# Patient Record
Sex: Male | Born: 1995 | Race: White | Hispanic: No | Marital: Single | Smoking: Never smoker
Health system: Southern US, Community
[De-identification: ages and names within clinical notes are randomized; demographics above are authoritative.]

## PROBLEM LIST (undated history)

## (undated) DIAGNOSIS — Z87898 Personal history of other specified conditions: Secondary | ICD-10-CM

## (undated) HISTORY — PX: INNER EAR SURGERY: SHX679

## (undated) HISTORY — PX: OTHER SURGICAL HISTORY: SHX169

---

## 2018-12-16 ENCOUNTER — Other Ambulatory Visit: Payer: Self-pay

## 2018-12-16 ENCOUNTER — Encounter (HOSPITAL_COMMUNITY): Payer: Self-pay | Admitting: Emergency Medicine

## 2018-12-16 ENCOUNTER — Emergency Department (HOSPITAL_COMMUNITY)
Admission: EM | Admit: 2018-12-16 | Discharge: 2018-12-16 | Disposition: A | Discharge status: Home / Self Care | Payer: Self-pay | Attending: Emergency Medicine | Admitting: Emergency Medicine

## 2018-12-16 ENCOUNTER — Emergency Department (HOSPITAL_COMMUNITY): Payer: Self-pay

## 2018-12-16 DIAGNOSIS — R569 Unspecified convulsions: Secondary | ICD-10-CM | POA: Insufficient documentation

## 2018-12-16 DIAGNOSIS — Z79899 Other long term (current) drug therapy: Secondary | ICD-10-CM | POA: Insufficient documentation

## 2018-12-16 LAB — CBC WITH DIFFERENTIAL/PLATELET
Abs Immature Granulocytes: 0.06 10*3/uL (ref 0.00–0.07)
Basophils Absolute: 0.1 10*3/uL (ref 0.0–0.1)
Basophils Relative: 0 %
Eosinophils Absolute: 0.1 10*3/uL (ref 0.0–0.5)
Eosinophils Relative: 1 %
HCT: 43.7 % (ref 39.0–52.0)
Hemoglobin: 14.1 g/dL (ref 13.0–17.0)
Immature Granulocytes: 0 %
Lymphocytes Relative: 20 %
Lymphs Abs: 3 10*3/uL (ref 0.7–4.0)
MCH: 26.9 pg (ref 26.0–34.0)
MCHC: 32.3 g/dL (ref 30.0–36.0)
MCV: 83.4 fL (ref 80.0–100.0)
Monocytes Absolute: 1.1 10*3/uL — ABNORMAL HIGH (ref 0.1–1.0)
Monocytes Relative: 7 %
Neutro Abs: 10.5 10*3/uL — ABNORMAL HIGH (ref 1.7–7.7)
Neutrophils Relative %: 72 %
Platelets: 351 10*3/uL (ref 150–400)
RBC: 5.24 MIL/uL (ref 4.22–5.81)
RDW: 13.2 % (ref 11.5–15.5)
WBC: 14.7 10*3/uL — ABNORMAL HIGH (ref 4.0–10.5)
nRBC: 0 % (ref 0.0–0.2)

## 2018-12-16 LAB — COMPREHENSIVE METABOLIC PANEL
ALT: 66 U/L — ABNORMAL HIGH (ref 0–44)
AST: 34 U/L (ref 15–41)
Albumin: 4 g/dL (ref 3.5–5.0)
Alkaline Phosphatase: 63 U/L (ref 38–126)
Anion gap: 11 (ref 5–15)
BUN: 13 mg/dL (ref 6–20)
CO2: 22 mmol/L (ref 22–32)
Calcium: 9.3 mg/dL (ref 8.9–10.3)
Chloride: 107 mmol/L (ref 98–111)
Creatinine, Ser: 1.12 mg/dL (ref 0.61–1.24)
GFR calc Af Amer: >60 mL/min (ref >60)
GFR calc non Af Amer: >60 mL/min (ref >60)
Glucose, Bld: 104 mg/dL — ABNORMAL HIGH (ref 70–99)
Potassium: 4 mmol/L (ref 3.5–5.1)
Sodium: 140 mmol/L (ref 135–145)
Total Bilirubin: 0.5 mg/dL (ref 0.3–1.2)
Total Protein: 6.9 g/dL (ref 6.5–8.1)

## 2018-12-16 LAB — URINALYSIS, ROUTINE W REFLEX MICROSCOPIC
Bacteria, UA: NONE SEEN
Bilirubin Urine: NEGATIVE
Glucose, UA: NEGATIVE mg/dL
Hgb urine dipstick: NEGATIVE
Ketones, ur: NEGATIVE mg/dL
Leukocytes,Ua: NEGATIVE
Nitrite: NEGATIVE
Protein, ur: 30 mg/dL — AB
Specific Gravity, Urine: 1.024 (ref 1.005–1.030)
pH: 7 (ref 5.0–8.0)

## 2018-12-16 LAB — RAPID URINE DRUG SCREEN, HOSP PERFORMED
Amphetamines: NOT DETECTED
Barbiturates: NOT DETECTED
Benzodiazepines: NOT DETECTED
Cocaine: NOT DETECTED
Opiates: NOT DETECTED
Tetrahydrocannabinol: NOT DETECTED

## 2018-12-16 MED ORDER — SODIUM CHLORIDE 0.9 % IV BOLUS
1000.0000 mL | Freq: Once | INTRAVENOUS | Status: AC
  Administered 2018-12-16: 1000 mL via INTRAVENOUS

## 2018-12-16 MED ORDER — ONDANSETRON 4 MG PO TBDP: 4.0000 mg | ORAL_TABLET | Freq: Once | ORAL | Status: DC

## 2018-12-16 NOTE — Discharge Instructions (Signed)
Please follow-up with the clinic that was provided for you in your discharge paperwork.  You were also given an ambulatory referral to neurology.  The neurology office will call you to schedule an appointment for follow-up.  In the meantime you should avoid driving and adhere to seizure precautions that were discussed while you are in the emergency department.  If you have another episode of seizure-like activity then you should return to your nearest emergency department for evaluation.

## 2018-12-16 NOTE — TOC Initial Note (Signed)
Transition of Care [facility]) - Initial/Assessment Note    Patient Details  Name: Parv Manthey MRN: [id number] Date of Birth: 02-20-1996  Transition of Care [facility]) CM/SW Contact:    Fuller Mandril, RN Phone Number: 12/16/2018, 2:23 PM  Clinical Narrative:                 [facility] consulted to assist uninsured pt with PCP establishment.     Expected Discharge Plan: [place] Barriers to Discharge: Barriers Resolved   Patient Goals and CMS Choice Patient states their goals for this hospitalization and ongoing recovery are:: get help with seizure      Expected Discharge Plan and Services Expected Discharge Plan: [place]   Discharge Planning Services: CM Consult, [facility]     Expected Discharge Date: 12/16/18                                    Prior Living Arrangements/Services                       Activities of Daily Living      Permission Sought/Granted                  Emotional Assessment   Attitude/Demeanor/Rapport: Engaged Affect (typically observed): Appropriate Orientation: : Oriented to Self, Oriented to Place, Oriented to  Time, Oriented to Situation Alcohol / Substance Use: Not Applicable Psych Involvement: No (comment)  Admission diagnosis:  sz There are no active problems to display for this patient.  PCP:  Patient, No Pcp Per Pharmacy:  No Pharmacies Listed    Social Determinants of Health (SDOH) Interventions    Readmission Risk Interventions No flowsheet data found.

## 2018-12-16 NOTE — TOC Transition Note (Signed)
Transition of Care [facility]) - CM/SW Discharge Note   Patient Details  Name: Dakota Perez MRN: [id number] Date of Birth: Jul 08, 1995  Transition of Care [facility]) CM/SW Contact:  Fuller Mandril, RN Phone Number: 12/16/2018, 2:24 PM   Clinical Narrative:    [facility] met with pt at bedside to confirm his residence was in [facility].  Pt states he has applied for medical assistance in the past but was unsuccessful due to having a job.   Final next level of care: [place] Barriers to Discharge: Barriers Resolved   Patient Goals and CMS Choice Patient states their goals for this hospitalization and ongoing recovery are:: get help with seizure      Discharge Placement                       Discharge Plan and Services   Discharge Planning Services: CM Consult, [facility] is not open on Friday, so [facility] not able to secure follow-up appointment.  Relayed information to pt and advised him to call early Monday morning to be seen ASAP.  Pt verbalized instructions.                  Social Determinants of Health (SDOH) Interventions     Readmission Risk Interventions No flowsheet data found.

## 2018-12-16 NOTE — ED Provider Notes (Signed)
[facility]   CSN: [id number] Arrival date & time: 12/16/18  1230     History   Chief Complaint Chief Complaint  Patient presents with  . Seizures    HPI Dakota PeachShane Perez is a 23 y.o. male.     HPI   Patient is a 23 year old male who presents to the emergency department today for evaluation of a possible seizure.  Patient's mother is at bedside and assists with the history.  Patient states that this morning he had been feeling very nauseated.  He did not get a lot of sleep last night because he has been traveling.  He was in on an airplane when he felt even more nauseated and then had what appeared to be a seizure.  His mother states that he became very rigid.  His arms were drawn towards his chest and his bilateral upper and lower extremities began to shake.  The episode lasted about 40 seconds and resolved.  She states that the patient was confused following this episode but she is not sure how long.  States following the episode he had multiple episodes of vomiting.  Denies urinary incontinence. Patient has returned to baseline since then.  He has never had a similar episode in the past.  Patient states he has been in his normal state of health prior to this episode.  He denies any headaches, vision changes, numbness, weakness to his arms or legs.  Denies any chest pain, shortness of breath, abdominal pain, nausea vomiting or diarrhea prior to the episode.  Denies any urinary symptoms or recent fevers.  Denies any use of drugs or alcohol.  Denies any recent head trauma.   History reviewed. No pertinent past medical history.  There are no active problems to display for this patient.   Past Surgical History:  Procedure Laterality Date  . INNER EAR SURGERY          Home Medications    Prior to Admission medications   Medication Sig Start Date End Date Taking? Authorizing Provider  Multiple Vitamins-Minerals (MULTIVITAMIN  ADULT PO) Take 2 tablets by mouth daily.   Yes [provider]    Family History History reviewed. No pertinent family history.  Social History Social History   Tobacco Use  . Smoking status: Not on file  Substance Use Topics  . Alcohol use: Not on file  . Drug use: Not on file     Allergies   Patient has no known allergies.   Review of Systems Review of Systems  Constitutional: Negative for chills and fever.  HENT: Negative for ear pain and sore throat.   Eyes: Negative for visual disturbance.  Respiratory: Negative for cough and shortness of breath.   Cardiovascular: Negative for chest pain.  Gastrointestinal: Positive for nausea and vomiting. Negative for abdominal pain, constipation and diarrhea.  Genitourinary: Negative for dysuria and hematuria.  Musculoskeletal: Negative for back pain.  Skin: Negative for color change and rash.  Neurological: Positive for seizures. Negative for dizziness, weakness, light-headedness, numbness and headaches.  All other systems reviewed and are negative.    Physical Exam Updated Vital Signs BP 118/75   Pulse 86   Temp 98.3 F (36.8 C) (Oral)   Resp 19   Ht 6' (1.829 m)   Wt 108.9 kg   SpO2 96%   BMI 32.55 kg/m   Physical Exam Vitals signs and nursing note reviewed.  Constitutional:      General: He is not  in acute distress.    Appearance: He is well-developed. He is not ill-appearing or toxic-appearing.  HENT:     Head: Normocephalic and atraumatic.     Mouth/Throat:     Mouth: Mucous membranes are moist.     Comments: No ecchymosis to the tongue Eyes:     Conjunctiva/sclera: Conjunctivae normal.  Neck:     Musculoskeletal: Neck supple.  Cardiovascular:     Rate and Rhythm: Normal rate and regular rhythm.     Heart sounds: Normal heart sounds. No murmur.  Pulmonary:     Effort: Pulmonary effort is normal. No respiratory distress.     Breath sounds: Normal breath sounds. No wheezing, rhonchi or rales.   Abdominal:     General: Bowel sounds are normal.     Palpations: Abdomen is soft.     Tenderness: There is no abdominal tenderness. There is no guarding or rebound.  Musculoskeletal:     Right lower leg: No edema.     Left lower leg: No edema.  Skin:    General: Skin is warm and dry.  Neurological:     Mental Status: He is alert.     Comments: Mental Status:  Alert, thought content appropriate, able to give a coherent history. Speech fluent without evidence of aphasia. Able to follow 2 step commands without difficulty.  Cranial Nerves:  II: pupils equal, round, reactive to light III,IV, VI: ptosis not present, extra-ocular motions intact bilaterally  V,VII: smile symmetric, facial light touch sensation equal VIII: hearing grossly normal to voice  X: uvula elevates symmetrically  XI: bilateral shoulder shrug symmetric and strong XII: midline tongue extension without fassiculations Motor:  Normal tone. 5/5 strength of BUE and BLE major muscle groups including strong and equal grip strength and dorsiflexion/plantar flexion Sensory: light touch normal in all extremities. Cerebellar: normal finger-to-nose with bilateral upper extremities      ED Treatments / Results  Labs (all labs ordered are listed, but only abnormal results are displayed) Labs Reviewed  CBC WITH DIFFERENTIAL/PLATELET - Abnormal; Notable for the following components:      Result Value   WBC 14.7 (*)    Neutro Abs 10.5 (*)    Monocytes Absolute 1.1 (*)    All other components within normal limits  COMPREHENSIVE METABOLIC PANEL - Abnormal; Notable for the following components:   Glucose, Bld 104 (*)    ALT 66 (*)    All other components within normal limits  URINALYSIS, ROUTINE W REFLEX MICROSCOPIC - Abnormal; Notable for the following components:   Protein, ur 30 (*)    All other components within normal limits  RAPID URINE DRUG SCREEN, HOSP PERFORMED  CBG MONITORING, ED    EKG None  Radiology Mr  Brain Wo Contrast  Result Date: 12/16/2018 CLINICAL DATA:  Seizure, new, nontraumatic. EXAM: MRI HEAD WITHOUT CONTRAST TECHNIQUE: Multiplanar, multiecho pulse sequences of the brain and surrounding structures were obtained without intravenous contrast. COMPARISON:  None. FINDINGS: Brain: No cortical finding to explain seizure. No swelling, infarction, hemorrhage, hydrocephalus, extra-axial collection or mass lesion. Vascular: Normal flow voids. Skull and upper cervical spine: Normal marrow signal. Sinuses/Orbits: Left retromastoid scarring, question prior mastoidectomy. IMPRESSION: Normal brain MRI. Electronically Signed   By: Marnee SpringJonathon  Watts M.D.   On: 12/16/2018 16:54    Procedures Procedures (including critical care time)  Medications Ordered in ED Medications  ondansetron (ZOFRAN-ODT) disintegrating tablet 4 mg (4 mg Oral Not Given 12/16/18 1546)  sodium chloride 0.9 % bolus 1,000 mL (1,000  mLs Intravenous New Bag/Given 12/16/18 1339)     Initial Impression / Assessment and Plan / ED Course  I have reviewed the triage vital signs and the nursing notes.  Pertinent labs & imaging results that were available during my care of the patient were reviewed by me and considered in my medical decision making (see chart for details).    Final Clinical Impressions(s) / ED Diagnoses   Final diagnoses:  Seizure-like activity ([place])   Pt presenting with new onset seizure. Does have recent decreased sleep and hydration due to traveling. No etoh or drug use. No recent head trauma. No infectious sxs.  VS reassuring. Exam is nonfocal and pt is alert and oriented.   Will check labs and imaging. CBC with mild leukocytosis, no anemia CMP with mildly elevated ALT, otherwise normal. UA with ketonuria but otherwise normal UDS negative  EKG is not pulling into chart, but was personally reviewed and showed normal sinus rhythm without ischemic changes.  No delta wave or evidence of Brugada syndrome.  MRI  showed no acute findings in the brain.  Pt given IVF and zofran. On reassessment patient continues to feel back to baseline.  No further episodes of vomiting while in the ED.  Discussed findings of work-up and plan for discharge.  Case management was consulted and discussed with the patient and family at bedside a plan for follow-up.  I also gave him a referral to neurology.  Seizure precautions were discussed.  Patient and family at bedside voiced understanding.  They are in agreement with plan.  Advised to return to the ER for new or worsening symptoms.  All questions answered.   Patient seen in conjunction with Dr. Roslynn Amble who personally evaluated the patient and is in agreement with plan.  ED Discharge Orders         Ordered    Ambulatory referral to Neurology    Comments: An appointment is requested in approximately: 1 week   12/16/18 1843           Bishop Dublin 12/16/18 1848    Lucrezia Starch, MD 12/17/18 [phone]

## 2018-12-16 NOTE — ED Triage Notes (Addendum)
Brought in by EMS. Pt was on a flight and had a witnessed seizure about 20 mins prior to landing. States he felt nauseated before seizure. Seizure lasted approximately 30 seconds and post ictal for about 10 mins. Vomited once after seizure. Ems reports pt was alert and oriented upon arrival. No history of seizures.

## 2019-10-24 ENCOUNTER — Telehealth: Payer: Self-pay | Admitting: General Practice

## 2019-10-24 NOTE — Telephone Encounter (Signed)
Attempts have been made to contact individual regarding ED referral. Call could not be completed.

## 2020-02-26 ENCOUNTER — Other Ambulatory Visit: Payer: Self-pay

## 2020-02-26 ENCOUNTER — Encounter: Payer: Self-pay | Admitting: Neurology

## 2020-02-26 ENCOUNTER — Ambulatory Visit
Admission: EM | Admit: 2020-02-26 | Discharge: 2020-02-26 | Disposition: A | Discharge status: Home / Self Care | Payer: Self-pay | Attending: Emergency Medicine | Admitting: Emergency Medicine

## 2020-02-26 DIAGNOSIS — R202 Paresthesia of skin: Secondary | ICD-10-CM

## 2020-02-26 HISTORY — DX: Personal history of other specified conditions: Z87.898

## 2020-02-26 NOTE — Discharge Instructions (Signed)
I am referring you to the bowel or neurology for duration of your symptoms.  If you have another seizure you need to go to the emergency department via EMS.

## 2020-02-26 NOTE — ED Triage Notes (Signed)
Patient reports that he has been having head tingling that occurs in the front and back of his head that started a while back but has increased over the last 2 weeks. States that he had a seizure around 1 year ago and had similar symptoms before this occurred. Reports that at time they did an MRI and bloodwork and did not find a cause for the seizure. Patient states that these "episodes" last for around 15-30 mins and occurs around 2-3 times a day, states that this happens typically at night.

## 2020-02-26 NOTE — ED Provider Notes (Signed)
[facility]    CSN: [id number] Arrival date & time: 02/26/20  1017      History   Chief Complaint Chief Complaint  Patient presents with  . Headache    HPI Dakota Perez is a 23 y.o. male.   24 year old male here for evaluation of tingling sensation in the front and back of his head.  Patient reports that this has been increasing over the past 2 weeks.  Patient reports that he had symptoms similar to this prior to having a seizure a year ago.  Patient was evaluated in the ER at that time and had a negative MRI but never followed up with neurology.  Patient is wondering if it might be eyestrain and wearing sunglasses helps.  Patient has an eye doctor appointment later this month.  Patient denies headache, visual changes, numbness, tingling, weakness, head trauma, new medications, or new supplement.     Past Medical History:  Diagnosis Date  . History of seizure     There are no problems to display for this patient.   Past Surgical History:  Procedure Laterality Date  . INNER EAR SURGERY         Home Medications    Prior to Admission medications   Medication Sig Start Date End Date Taking? Authorizing Provider  Multiple Vitamins-Minerals (MULTIVITAMIN ADULT PO) Take 2 tablets by mouth daily.   Yes [provider]    Family History Family History  Problem Relation Age of Onset  . Healthy Mother   . Healthy Father     Social History Social History   Tobacco Use  . Smoking status: Never Smoker  . Smokeless tobacco: Never Used  Vaping Use  . Vaping Use: Never used  Substance Use Topics  . Alcohol use: Never  . Drug use: Never     Allergies   Patient has no known allergies.   Review of Systems Review of Systems  Constitutional: Negative for activity change, appetite change, fatigue and fever.  Respiratory: Negative for cough and shortness of breath.   Cardiovascular: Negative for chest pain and palpitations.  Gastrointestinal:  Negative for nausea and vomiting.  Musculoskeletal: Negative for arthralgias, gait problem and myalgias.  Skin: Negative for rash.  Neurological: Negative for dizziness, tremors, seizures, syncope, light-headedness, numbness and headaches.  Hematological: Negative.   Psychiatric/Behavioral: Negative.      Physical Exam Triage Vital Signs ED Triage Vitals  Enc Vitals Group     BP 02/26/20 1106 132/82     Pulse Rate 02/26/20 1106 81     Resp 02/26/20 1106 18     Temp 02/26/20 1106 98.6 F (37 C)     Temp Source 02/26/20 1106 Oral     SpO2 02/26/20 1106 97 %     Weight 02/26/20 1103 250 lb (113.4 kg)     Height 02/26/20 1103 6\' 1"  (1.854 m)     Head Circumference --      Peak Flow --      Pain Score 02/26/20 1103 0     Pain Loc --      Pain Edu? --      Excl. in GC? --    No data found.  Updated Vital Signs BP 132/82 (BP Location: Left Arm)   Pulse 81   Temp 98.6 F (37 C) (Oral)   Resp 18   Ht 6\' 1"  (1.854 m)   Wt 250 lb (113.4 kg)   SpO2 97%   BMI 32.98 kg/m  Visual Acuity Right Eye Distance:   Left Eye Distance:   Bilateral Distance:    Right Eye Near:   Left Eye Near:    Bilateral Near:     Physical Exam Vitals and nursing note reviewed.  Constitutional:      General: He is not in acute distress.    Appearance: He is well-developed. He is not toxic-appearing.  HENT:     Head: Normocephalic and atraumatic.     Mouth/Throat:     Mouth: Mucous membranes are moist.     Pharynx: Oropharynx is clear.  Eyes:     General: No visual field deficit or scleral icterus.    Extraocular Movements: Extraocular movements intact.     Right eye: Normal extraocular motion and no nystagmus.     Left eye: Normal extraocular motion and no nystagmus.     Pupils: Pupils are equal, round, and reactive to light. Pupils are equal.     Right eye: Pupil is round and reactive.     Left eye: Pupil is round and reactive.     Comments: Patient has no visual field deficits.    Cardiovascular:     Rate and Rhythm: Normal rate and regular rhythm.     Heart sounds: Normal heart sounds. No murmur heard.  No friction rub. No gallop.   Pulmonary:     Effort: Pulmonary effort is normal.     Breath sounds: Normal breath sounds. No wheezing, rhonchi or rales.  Musculoskeletal:        General: No swelling or tenderness. Normal range of motion.     Cervical back: Normal range of motion and neck supple.     Comments: Bilateral grips 5/5, upper extremity strength 5/5 bilaterally, lower extremity strength 5/5 bilaterally.  Lymphadenopathy:     Cervical: No cervical adenopathy.  Skin:    General: Skin is warm and dry.     Capillary Refill: Capillary refill takes less than 2 seconds.     Findings: No erythema.  Neurological:     Mental Status: He is alert and oriented to person, place, and time.     GCS: GCS eye subscore is 4. GCS verbal subscore is 5. GCS motor subscore is 6.     Cranial Nerves: No cranial nerve deficit, dysarthria or facial asymmetry.     Sensory: No sensory deficit.     Motor: No weakness.     Coordination: Coordination normal.     Gait: Gait normal.     Deep Tendon Reflexes: Reflexes normal.  Psychiatric:        Mood and Affect: Mood normal.        Speech: Speech normal.        Behavior: Behavior normal.      UC Treatments / Results  Labs (all labs ordered are listed, but only abnormal results are displayed) Labs Reviewed - No data to display  EKG   Radiology No results found.  Procedures Procedures (including critical care time)  Medications Ordered in UC Medications - No data to display  Initial Impression / Assessment and Plan / UC Course  I have reviewed the triage vital signs and the nursing notes.  Pertinent labs & imaging results that were available during my care of the patient were reviewed by me and considered in my medical decision making (see chart for details).   Patient has been having a "weird, vague tingle" to  his forehead and the crown of his head.  This has been going on  for 2 weeks.  He thinks it may have been off and on longer but is unsure.  He had similar symptoms 1 year ago before having a seizure.  He had a seizure while on an airplane and was taken from [place]  to [facility] for evaluation.  He reports that he had an MRI at that time and was told it was negative and he had no brain tumor.  The report from [facility] does not indicate any imaging was done.  Patient was referred to neurology but it is unclear if he ever followed up.  Neuro exam is benign in clinic.  Will refer back to neurology for further work-up and evaluation.  Patient advised that if he had another seizure he was to go to the emergency room.   Final Clinical Impressions(s) / UC Diagnoses   Final diagnoses:  Tingling     Discharge Instructions     I am referring you to the bowel or neurology for duration of your symptoms.  If you have another seizure you need to go to the emergency department via EMS.    ED Prescriptions    None     PDMP not reviewed this encounter.   Becky Augusta, NP 02/26/20 1147

## 2020-03-04 ENCOUNTER — Other Ambulatory Visit: Payer: Self-pay

## 2020-03-04 ENCOUNTER — Encounter: Payer: Self-pay | Admitting: Neurology

## 2020-03-04 ENCOUNTER — Ambulatory Visit (INDEPENDENT_AMBULATORY_CARE_PROVIDER_SITE_OTHER): Payer: Self-pay | Admitting: Neurology

## 2020-03-04 VITALS — BP 121/83 | HR 100 | Ht 73.0 in | Wt 257.2 lb

## 2020-03-04 DIAGNOSIS — R569 Unspecified convulsions: Secondary | ICD-10-CM

## 2020-03-04 NOTE — Patient Instructions (Signed)
Good to meet you! Schedule 1-hour EEG. If normal, we will do a 48-hour EEG. Follow-up after tests, call for any change in symptoms.   Seizure Precautions: 1. If medication has been prescribed for you to prevent seizures, take it exactly as directed.  Do not stop taking the medicine without talking to your doctor first, even if you have not had a seizure in a long time.   2. Avoid activities in which a seizure would cause danger to yourself or to others.  Don't operate dangerous machinery, swim alone, or climb in high or dangerous places, such as on ladders, roofs, or girders.  Do not drive unless your doctor says you may.  3. If you have any warning that you may have a seizure, lay down in a safe place where you can't hurt yourself.    4.  No driving for 6 months from last seizure, as per [facility].   Please refer to the following link on the [employer]'s website for more information: http://www.epilepsyfoundation.org/answerplace/Social/driving/drivingu.cfm   5.  Maintain good sleep hygiene. Avoid alcohol.  6.  Contact your doctor if you have any problems that may be related to the medicine you are taking.  7.  Call 911 and bring the patient back to the ED if:        A.  The seizure lasts longer than 5 minutes.       B.  The patient doesn't awaken shortly after the seizure  C.  The patient has new problems such as difficulty seeing, speaking or moving  D.  The patient was injured during the seizure  E.  The patient has a temperature over 102 F (39C)  F.  The patient vomited and now is having trouble breathing

## 2020-03-04 NOTE — Progress Notes (Signed)
NEUROLOGY CONSULTATION NOTE  Dakota Perez MRN: [id number] DOB: 1996-03-27  Referring provider: Becky Augusta, NP Primary care provider: none listed  Reason for consult:  seizure   Thank you for your kind referral of Dakota Perez for consultation of the above symptoms. Although his history is well known to you, please allow me to reiterate it for the purpose of our medical record. The patient alone in the office today. Records and images were personally reviewed where available.   HISTORY OF PRESENT ILLNESS: This is a pleasant 23 year old right-handed man with history of left inner ear surgery, presenting for evaluation of new onset seizure that occurred last 11/2018. He was on the plane coming back from vacation when he started feeling nauseated, which he attributed to flight turbulence. He then started feeling a sensation across his forehead then his mother witnessed him become very rigid, arms drawn towards his chest with bilateral upper and lower extremity shaking. The episode lasted 40 seconds, his mother states he was confused after and had multiple episodes of vomiting. No tongue bite or incontinence. He recalls waking up in the plane feeling really tired and sleepy, he recalls answering questions. He was brought to [facility] where CBC, CMP, UDS were negative. I personally reviewed MRI brain without contrast which was normal, hippocampi symmetric with no abnormal signal seen. He reported he did not get a lot of sleep the night prior due to travel. No alcohol or drug intake. Afterwards, he recalls having similar sensations across his forehead when he shaved his head in September, but he is not sure. He would have the similar sort of small sensations very briefly, however over the past 2-3 weeks, they were more frequent and longer. He was at school last 11/1 when he started feeling the same sensation across his forehead, he walked out the classroom and started to feel a little nausea. He had to lay down  and started feeling better after 10 minutes but he was not completely back to usual baseline so he went to the ER where exam was normal. Last Friday, he was feeling the sensation most of the day. He denies any head pain or clear dizziness. He can talk but feels that speaking is a little difficult at times. When episodes are more intense, there is a small bit of nausea, no further vomiting similar to a year ago. He has noticed he is more sensitive to lights and some sounds, his eyes would feel overstimulated after working on the computer. Closing his eyes does not help, dimming the lights helps a little. He lives with his family and denies being told of any staring/unresponsive episodes. He denies any other gaps in time, olfactory/gustatory hallucinations, deja vu, rising epigastric sensation, focal numbness/tingling/weakness, myoclonic jerks. He denies any diplopia, dysarthria/dysphagia, back pain, bowel/bladder dysfunction. Sometimes when laying in bed, he feels a sensation in the back of his head. He usually gets 8-10 hours of sleep but has some difficulty with sleep initiation. Memory is alright. He is in college at [employer] getting an Associate's degree in Fine Arts.   Epilepsy Risk Factors:  His deceased older brother had drug-related seizures. He had a normal birth and early development.  There is no history of febrile convulsions, CNS infections such as meningitis/encephalitis, significant traumatic brain injury, neurosurgical procedures.    PAST MEDICAL HISTORY: Past Medical History:  Diagnosis Date  . History of seizure     PAST SURGICAL HISTORY: Past Surgical History:  Procedure Laterality Date  . INNER  EAR SURGERY      MEDICATIONS: Current Outpatient Medications on File Prior to Visit  Medication Sig Dispense Refill  . Multiple Vitamins-Minerals (MULTIVITAMIN ADULT PO) Take 2 tablets by mouth daily.     No current facility-administered medications on file prior to visit.     ALLERGIES: No Known Allergies  FAMILY HISTORY: Family History  Problem Relation Age of Onset  . Healthy Mother   . Healthy Father     SOCIAL HISTORY: Social History   Socioeconomic History  . Marital status: Single    Spouse name: Not on file  . Number of children: Not on file  . Years of education: Not on file  . Highest education level: Not on file  Occupational History  . Not on file  Tobacco Use  . Smoking status: Never Smoker  . Smokeless tobacco: Never Used  Vaping Use  . Vaping Use: Never used  Substance and Sexual Activity  . Alcohol use: Never  . Drug use: Never  . Sexual activity: Not on file  Other Topics Concern  . Not on file  Social History Narrative   Right handed    Lives with family    Social Determinants of Health   Financial Resource Strain:   . Difficulty of Paying Living Expenses: Not on file  Food Insecurity:   . Worried About Programme researcher, broadcasting/film/video in the Last Year: Not on file  . Ran Out of Food in the Last Year: Not on file  Transportation Needs:   . Lack of Transportation (Medical): Not on file  . Lack of Transportation (Non-Medical): Not on file  Physical Activity:   . Days of Exercise per Week: Not on file  . Minutes of Exercise per Session: Not on file  Stress:   . Feeling of Stress : Not on file  Social Connections:   . Frequency of Communication with Friends and Family: Not on file  . Frequency of Social Gatherings with Friends and Family: Not on file  . Attends Religious Services: Not on file  . Active Member of Clubs or Organizations: Not on file  . Attends Banker Meetings: Not on file  . Marital Status: Not on file  Intimate Partner Violence:   . Fear of Current or Ex-Partner: Not on file  . Emotionally Abused: Not on file  . Physically Abused: Not on file  . Sexually Abused: Not on file     PHYSICAL EXAM: Vitals:   03/04/20 0847  BP: 121/83  Pulse: 100  SpO2: 98%   General: No acute  distress Head:  Normocephalic/atraumatic Skin/Extremities: No rash, no edema Neurological Exam: Mental status: alert and oriented to person, place, and time, no dysarthria or aphasia, Fund of knowledge is appropriate.  Recent and remote memory are intact. 3/3 delayed recall. Attention and concentration are normal.   Cranial nerves: CN I: not tested CN II: pupils equal, round and reactive to light, visual fields intact CN III, IV, VI:  full range of motion, no nystagmus, no ptosis CN V: facial sensation intact CN VII: upper and lower face symmetric CN VIII: hearing intact to conversation Bulk & Tone: normal, no fasciculations. Motor: 5/5 throughout with no pronator drift. Sensation: intact to light touch, cold, pin, vibration and joint position sense.  No extinction to double simultaneous stimulation.  Romberg test negative Deep Tendon Reflexes: +2 throughout Cerebellar: no incoordination on finger to nose testing Gait: narrow-based and steady, able to tandem walk adequately. Tremor: none   IMPRESSION:  This is a pleasant 24 year old right-handed man with history of left inner ear surgery, presenting for evaluation of new onset seizure that occurred last 11/2018. He recalls having nausea and a sensation across his forehead prior to the seizure. MRI brain in 11/2018 normal. No further convulsions since then, however he continues to feel the sensations across his forehead, etiology unclear. They may be simple partial seizures, he denies any episodes of loss of awareness. His neurological exam today is normal. A 1-hour EEG will be ordered, if normal we will do a 48-hour EEG to further classify events. We agreed to hold off on AED for now pending completion of tests.  driving laws were discussed with the patient, and he knows to stop driving after a seizure, until 6 months seizure-free. Follow-up after tests, he knows to call for any changes.   Thank you for allowing me to participate in the care  of this patient. Please do not hesitate to call for any questions or concerns.   Patrcia Dolly, M.D.  CC: Becky Augusta, NP

## 2020-03-06 ENCOUNTER — Telehealth: Payer: Self-pay

## 2020-03-06 ENCOUNTER — Ambulatory Visit (INDEPENDENT_AMBULATORY_CARE_PROVIDER_SITE_OTHER): Payer: Self-pay | Admitting: Neurology

## 2020-03-06 ENCOUNTER — Other Ambulatory Visit: Payer: Self-pay

## 2020-03-06 DIAGNOSIS — R569 Unspecified convulsions: Secondary | ICD-10-CM

## 2020-03-06 NOTE — Telephone Encounter (Signed)
-----   Message from Van Clines, MD sent at 03/06/2020  4:11 PM EST ----- Pls let him know the EEG was normal, proceed with prolonged EEG as discussed. Thanks

## 2020-03-06 NOTE — Procedures (Signed)
ELECTROENCEPHALOGRAM REPORT  Date of Study: 03/06/2020  Patient's Name: Dakota Perez MRN: [id number] Date of Birth: 08-14-1995  Referring Provider: Dr. Patrcia Dolly  Clinical History: This is a 25 year old man with new onset seizure in 11/2018, with recurrent episodes of a sensation across the head with nausea. EEG for classification  Medications: multivitamins  Technical Summary: A multichannel digital 1-hour EEG recording measured by the international 10-20 system with electrodes applied with paste and impedances below 5000 ohms performed in our laboratory with EKG monitoring in an awake and asleep patient.  Hyperventilation was not performed. Photic stimulation was performed.  The digital EEG was referentially recorded, reformatted, and digitally filtered in a variety of bipolar and referential montages for optimal display.    Description: The patient is awake and asleep during the recording.  During maximal wakefulness, there is a symmetric, low voltage 12 Hz posterior dominant rhythm that attenuates with eye opening.  The record is symmetric.  During drowsiness and sleep, there is an increase in theta slowing of the background.  Vertex waves and symmetric sleep spindles were seen. Photic stimulation did not elicit any abnormalities.  There were no epileptiform discharges or electrographic seizures seen.    EKG lead was unremarkable.  Impression: This  1-hour awake and asleep EEG is normal.    Clinical Correlation: A normal EEG does not exclude a clinical diagnosis of epilepsy.  If further clinical questions remain, prolonged EEG may be helpful.  Clinical correlation is advised.   Patrcia Dolly, M.D.

## 2020-03-06 NOTE — Telephone Encounter (Signed)
Pt called and informed that EEG was normal, proceed with prolonged EEG as discussed

## 2020-03-18 ENCOUNTER — Ambulatory Visit (INDEPENDENT_AMBULATORY_CARE_PROVIDER_SITE_OTHER): Payer: Self-pay | Admitting: Neurology

## 2020-03-18 ENCOUNTER — Other Ambulatory Visit: Payer: Self-pay

## 2020-03-18 DIAGNOSIS — R569 Unspecified convulsions: Secondary | ICD-10-CM

## 2020-04-01 ENCOUNTER — Telehealth: Payer: Self-pay

## 2020-04-01 NOTE — Telephone Encounter (Signed)
Spoke with Dakota Perez and informed him EEG was normal, Dr Karel Jarvis did not see any seizure activity. He stated that he is still having Symptoms when asked how he is feeling.

## 2020-04-01 NOTE — Telephone Encounter (Signed)
-----   Message from Van Clines, MD sent at 04/01/2020  7:05 AM EST ----- Regarding: EEG results Good morning! Pls let Jahki know that the EEG was normal, I did not see any seizure activity. How is he feeling? Will discuss next steps with him when I return next week. Thanks!

## 2020-04-10 ENCOUNTER — Encounter: Payer: Self-pay | Admitting: Neurology

## 2020-04-10 ENCOUNTER — Other Ambulatory Visit (INDEPENDENT_AMBULATORY_CARE_PROVIDER_SITE_OTHER): Payer: Self-pay

## 2020-04-10 ENCOUNTER — Ambulatory Visit (INDEPENDENT_AMBULATORY_CARE_PROVIDER_SITE_OTHER): Payer: Self-pay | Admitting: Neurology

## 2020-04-10 ENCOUNTER — Other Ambulatory Visit: Payer: Self-pay

## 2020-04-10 VITALS — BP 129/87 | HR 90 | Resp 18 | Ht 73.0 in | Wt 253.0 lb

## 2020-04-10 DIAGNOSIS — R569 Unspecified convulsions: Secondary | ICD-10-CM

## 2020-04-10 DIAGNOSIS — R002 Palpitations: Secondary | ICD-10-CM

## 2020-04-10 LAB — COMPREHENSIVE METABOLIC PANEL
ALT: 75 U/L — ABNORMAL HIGH (ref 0–53)
AST: 38 U/L — ABNORMAL HIGH (ref 0–37)
Albumin: 4.7 g/dL (ref 3.5–5.2)
Alkaline Phosphatase: 65 U/L (ref 39–117)
BUN: 12 mg/dL (ref 6–23)
CO2: 28 mEq/L (ref 19–32)
Calcium: 10 mg/dL (ref 8.4–10.5)
Chloride: 101 mEq/L (ref 96–112)
Creatinine, Ser: 1.01 mg/dL (ref 0.40–1.50)
GFR: 105.33 mL/min (ref >60.00)
Glucose, Bld: 82 mg/dL (ref 70–99)
Potassium: 4.4 mEq/L (ref 3.5–5.1)
Sodium: 137 mEq/L (ref 135–145)
Total Bilirubin: 0.5 mg/dL (ref 0.2–1.2)
Total Protein: 7.9 g/dL (ref 6.0–8.3)

## 2020-04-10 LAB — CBC
HCT: 44.1 % (ref 39.0–52.0)
Hemoglobin: 14.8 g/dL (ref 13.0–17.0)
MCHC: 33.6 g/dL (ref 30.0–36.0)
MCV: 80.2 fl (ref 78.0–100.0)
Platelets: 303 10*3/uL (ref 150.0–400.0)
RBC: 5.5 Mil/uL (ref 4.22–5.81)
RDW: 13.6 % (ref 11.5–15.5)
WBC: 8.3 10*3/uL (ref 4.0–10.5)

## 2020-04-10 LAB — TSH: TSH: 2.23 u[IU]/mL (ref 0.35–4.50)

## 2020-04-10 NOTE — Procedures (Signed)
ELECTROENCEPHALOGRAM REPORT  Dates of Recording: 03/18/2020 7:54AM to 03/20/2020 7:57AM  Patient's Name: Dakota Perez MRN: [id number] Date of Birth: [birth year]  Referring Provider: Dr. Patrcia Dolly  Procedure: 48-hour ambulatory video EEG  History: This is a 24 year old man with new onset seizure in 11/2018, recurrent episodes of sensory symptoms. EEG for classification.  Medications: multivitamin  Technical Summary: This is a 48-hour multichannel digital video EEG recording measured by the international 10-20 system with electrodes applied with paste and impedances below 5000 ohms performed as portable with EKG monitoring.  The digital EEG was referentially recorded, reformatted, and digitally filtered in a variety of bipolar and referential montages for optimal display.    DESCRIPTION OF RECORDING: During maximal wakefulness, the background activity consisted of a symmetric 10 Hz posterior dominant rhythm which was reactive to eye opening.  There were no epileptiform discharges or focal slowing seen in wakefulness.  During the recording, the patient progresses through wakefulness, drowsiness, and Stage 2 sleep.  Again, there were no epileptiform discharges seen.  Events: On 11/22 at 1232 hours, he has a slightly weird feeling in his head. Patient is sitting with no clinical changes seen. Electrographically, there were no EEG or EKG changes seen.  On 11/23 at 1624 hours, he has a slightly weird feeling again, not much. Patient sitting, no clinical changes seen. Electrographically, there were no EEG or EKG changes seen.  On 11/23 at 2328 hours, he reports a weird headache in the front of his head, mild pain. He is sitting with no clinical changes seen. Electrographically, there were no EEG or EKG changes seen.  There were no electrographic seizures seen.  EKG lead was unremarkable.  IMPRESSION: This 48-hour ambulatory video EEG study is normal.    CLINICAL CORRELATION: A normal EEG  does not exclude a clinical diagnosis of epilepsy. Mild symptoms of weird feeling in head/headache did not show epileptiform correlate.  If further clinical questions remain, inpatient video EEG monitoring may be helpful.   Patrcia Dolly, M.D.

## 2020-04-10 NOTE — Progress Notes (Signed)
NEUROLOGY FOLLOW UP OFFICE NOTE  Eason Housman [id number] [birth year]  HISTORY OF PRESENT ILLNESS: I had the pleasure of seeing Jestin Burbach in follow-up in the neurology clinic on 04/10/2020.  The patient was last seen a month ago after he had a convulsion in 11/2018 then recurrent episodes of sensory symptoms with some nausea. Records and images were personally reviewed where available.  His 1-hour and 48-hour EEG were normal, he had very mild episodes of weird sensation over his head with no EEG changes seen. He feels that the episodes have increased in frequency since the EEG was done. He describes a flutter in his chest, tingling in his head with nausea if more severe. Severe symptoms last for half an hour where it is hard to do anything, it takes 15-30 minutes to situate himself. There is no clear head pain but he states that one way or another he is just not feeling good. This can be every other day or a few days in a row. He points to his chest, then his head, but has difficulty describing his symptoms. He notes that after standing he feels it in his heart. We discussed anxiety, he states he is not feeling anxious when symptoms start, they come on suddenly, then he would start feeling anxious about what he feels. He reports a history of trying different antidepressants in the past which made him feel different.    History on Initial Assessment 03/04/2020: This is a pleasant 24 year old right-handed man with history of left inner ear surgery, presenting for evaluation of new onset seizure that occurred last 11/2018. He was on the plane coming back from vacation when he started feeling nauseated, which he attributed to flight turbulence. He then started feeling a sensation across his forehead then his mother witnessed him become very rigid, arms drawn towards his chest with bilateral upper and lower extremity shaking. The episode lasted 40 seconds, his mother states he was confused after and had multiple  episodes of vomiting. No tongue bite or incontinence. He recalls waking up in the plane feeling really tired and sleepy, he recalls answering questions. He was brought to [facility] where CBC, CMP, UDS were negative. I personally reviewed MRI brain without contrast which was normal, hippocampi symmetric with no abnormal signal seen. He reported he did not get a lot of sleep the night prior due to travel. No alcohol or drug intake. Afterwards, he recalls having similar sensations across his forehead when he shaved his head in September, but he is not sure. He would have the similar sort of small sensations very briefly, however over the past 2-3 weeks, they were more frequent and longer. He was at school last 11/1 when he started feeling the same sensation across his forehead, he walked out the classroom and started to feel a little nausea. He had to lay down and started feeling better after 10 minutes but he was not completely back to usual baseline so he went to the ER where exam was normal. Last Friday, he was feeling the sensation most of the day. He denies any head pain or clear dizziness. He can talk but feels that speaking is a little difficult at times. When episodes are more intense, there is a small bit of nausea, no further vomiting similar to a year ago. He has noticed he is more sensitive to lights and some sounds, his eyes would feel overstimulated after working on the computer. Closing his eyes does not help, dimming the lights  helps a little. He lives with his family and denies being told of any staring/unresponsive episodes. He denies any other gaps in time, olfactory/gustatory hallucinations, deja vu, rising epigastric sensation, focal numbness/tingling/weakness, myoclonic jerks. He denies any diplopia, dysarthria/dysphagia, back pain, bowel/bladder dysfunction. Sometimes when laying in bed, he feels a sensation in the back of his head. He usually gets 8-10 hours of sleep but has some difficulty with sleep  initiation. Memory is alright. He is in college at [employer] getting an Associate's degree in Fine Arts.   Epilepsy Risk Factors:  His deceased older brother had drug-related seizures. He had a normal birth and early development.  There is no history of febrile convulsions, CNS infections such as meningitis/encephalitis, significant traumatic brain injury, neurosurgical procedures.   Diagnostic Data: MRI brain without contrast 11/2018 was normal, hippocampi symmetric with no abnormal signal seen.  Routine EEG 02/2020 normal 48-hour EEG 02/2020 normal. Two episodes of mild weird sensation over his head did not show any EEG correlate.    PAST MEDICAL HISTORY: Past Medical History:  Diagnosis Date  . History of seizure     MEDICATIONS: Current Outpatient Medications on File Prior to Visit  Medication Sig Dispense Refill  . Multiple Vitamins-Minerals (MULTIVITAMIN ADULT PO) Take 2 tablets by mouth daily.     No current facility-administered medications on file prior to visit.    ALLERGIES: No Known Allergies  FAMILY HISTORY: Family History  Problem Relation Age of Onset  . Healthy Mother   . Healthy Father     SOCIAL HISTORY: Social History   Socioeconomic History  . Marital status: Single    Spouse name: Not on file  . Number of children: Not on file  . Years of education: Not on file  . Highest education level: Not on file  Occupational History  . Not on file  Tobacco Use  . Smoking status: Never Smoker  . Smokeless tobacco: Never Used  Vaping Use  . Vaping Use: Never used  Substance and Sexual Activity  . Alcohol use: Never  . Drug use: Never  . Sexual activity: Not on file  Other Topics Concern  . Not on file  Social History Narrative   Right handed    Lives with family    Social Determinants of Health   Financial Resource Strain: Not on file  Food Insecurity: Not on file  Transportation Needs: Not on file  Physical Activity: Not on file  Stress: Not on  file  Social Connections: Not on file  Intimate Partner Violence: Not on file     PHYSICAL EXAM: Vitals:   04/10/20 1332  BP: 129/87  Pulse: 90  Resp: 18  SpO2: 97%   General: No acute distress Head:  Normocephalic/atraumatic Skin/Extremities: No rash, no edema Neurological Exam: alert and awake. No aphasia or dysarthria. Fund of knowledge is appropriate.  Attention and concentration are normal.   Cranial nerves: Pupils equal, round. Extraocular movements intact.  Motor: moves all extremities symmetrically. Gait narrow-based and steady   IMPRESSION: This is a pleasant 24 yo RH man with history of left inner ear surgery, new onset convulsion in 11/2018 that was preceded by nausea and a sensation across his forehead. No further convulsions since 11/2018, however he continues to have recurrent sensory symptoms where he describes a sensation in his head and now his chest as well. Etiology of symptoms unclear, simple partial seizures are still a consideration, we discussed doing a therapeutic trial with Lamotrigine, side effects including Levonne Spiller syndrome  were discussed. He is hesitant to start medication and is concerned about other potential non-neurological causes of symptoms. With report of palpitations, echocardiogram will be ordered. The 1-lead EKG on his 48-hour EEG was normal with no arrhythmia seen. Check CBC, CMP, TSH. We discussed how anxiety can also cause similar symptoms. He was advised to keep a calendar of his symptoms. He would like to wait for echocardiogram results first and consider starting Lamotrigine after.  He is aware of [place] driving laws to stop driving after an episode of loss of consciousness until 6 months event-free. Follow-up in 3- months, he knows to call for any changes.    Patrcia Dolly, M.D.

## 2020-04-10 NOTE — Patient Instructions (Addendum)
1. Schedule echocardiogram  2. Bloodwork for CBC, CMP, TSH  3. Once we get results of echocardiogram, we will call you and if normal, can consider starting Lamotrigine (Lamictal) for presumed seizures. Keep a calendar of your symptoms  4. Continue with stress management, consider counseling for anxiety management if you wish  5. Follow-up in 3-4 months, call for any changes

## 2020-04-11 ENCOUNTER — Telehealth: Payer: Self-pay

## 2020-04-11 NOTE — Telephone Encounter (Signed)
-----   Message from Karen M Aquino, MD sent at 04/11/2020 12:21 PM EST ----- Pls let him know that the bloodwork overall okay, counts, thyroid, kidney function okay. The liver function tests are slightly abnormal, this would not cause his symptoms, but he does need to follow up with a PCP for this. Pls have him contact PCP list on his insurance plan so he has follow-up. Thanks 

## 2020-04-11 NOTE — Telephone Encounter (Signed)
Pt called no answer left a voice mail to call the office back  °

## 2020-04-12 ENCOUNTER — Telehealth: Payer: Self-pay

## 2020-04-12 NOTE — Telephone Encounter (Signed)
Pt called into the office re-turning nurses call, pt informed that bloodwork overall okay, counts, thyroid, kidney function okay. The liver function tests are slightly abnormal, this would not cause his symptoms, but he does need to follow up with a PCP for this. Pls have him contact PCP list on his insurance plan so he has follow-up

## 2020-04-12 NOTE — Telephone Encounter (Signed)
-----   Message from Van Clines, MD sent at 04/11/2020 12:21 PM EST ----- Pls let him know that the bloodwork overall okay, counts, thyroid, kidney function okay. The liver function tests are slightly abnormal, this would not cause his symptoms, but he does need to follow up with a PCP for this. Pls have him contact PCP list on his insurance plan so he has follow-up. Thanks

## 2020-04-22 ENCOUNTER — Telehealth: Payer: Self-pay | Admitting: Neurology

## 2020-04-22 NOTE — Telephone Encounter (Signed)
Patient called in and stated he was supposed to get an echocardiogram, but has not heard anything else about it. I don't see a referral for it in the system. Can someone give him a call and give him information on where he should be scheduling that?

## 2020-04-22 NOTE — Telephone Encounter (Signed)
[phone]. Pt to contact [facility].

## 2020-04-30 ENCOUNTER — Ambulatory Visit (HOSPITAL_COMMUNITY)
Admission: RE | Admit: 2020-04-30 | Discharge: 2020-04-30 | Disposition: A | Discharge status: Home / Self Care | Payer: Self-pay | Source: Ambulatory Visit | Attending: Neurology | Admitting: Neurology

## 2020-04-30 ENCOUNTER — Other Ambulatory Visit: Payer: Self-pay | Admitting: Neurology

## 2020-04-30 ENCOUNTER — Other Ambulatory Visit: Payer: Self-pay

## 2020-04-30 DIAGNOSIS — R569 Unspecified convulsions: Secondary | ICD-10-CM

## 2020-04-30 DIAGNOSIS — R002 Palpitations: Secondary | ICD-10-CM

## 2020-04-30 LAB — ECHOCARDIOGRAM COMPLETE
Area-P 1/2: 3.03 cm2
S' Lateral: 2.9 cm

## 2020-05-01 ENCOUNTER — Telehealth: Payer: Self-pay

## 2020-05-01 ENCOUNTER — Other Ambulatory Visit: Payer: Self-pay | Admitting: Neurology

## 2020-05-01 MED ORDER — LAMOTRIGINE 25 MG PO TABS: ORAL_TABLET | ORAL | 3 refills | Status: DC

## 2020-05-01 NOTE — Telephone Encounter (Signed)
Pt returned call to the office and was informed that echocardiogram was normal. He would like to start Lamotigine pharmacy on file is [facility] in [facility]

## 2020-05-01 NOTE — Telephone Encounter (Signed)
Pt called no answer left a voice mail for pt to call the office back  

## 2020-05-01 NOTE — Telephone Encounter (Signed)
Pls let him know Lamotrigine 25mg  sent to pharmacy: take 1 tab BID for 2 weeks, then 2 tabs BID. Side effects can be dizziness, double vision, very rarely a very bad rash so if rash occurs, stop medication and call . Pls have him keep a diary of his symptoms as we start medication so we can review on his f/u in March. Thanks

## 2020-05-01 NOTE — Telephone Encounter (Signed)
-----   Message from Karen M Aquino, MD sent at 05/01/2020  8:44 AM EST ----- Pls let him know the echocardiogram was normal. We had discussed the option of starting Lamotrigine if echo was normal, pls ask him how he would like to proceed. Thanks 

## 2020-05-01 NOTE — Telephone Encounter (Signed)
-----   Message from Van Clines, MD sent at 05/01/2020  8:44 AM EST ----- Pls let him know the echocardiogram was normal. We had discussed the option of starting Lamotrigine if echo was normal, pls ask him how he would like to proceed. Thanks

## 2020-05-02 NOTE — Telephone Encounter (Signed)
Patient advised.

## 2020-05-23 ENCOUNTER — Ambulatory Visit: Payer: Medicaid Other | Admitting: Neurology

## 2020-05-30 ENCOUNTER — Ambulatory Visit (INDEPENDENT_AMBULATORY_CARE_PROVIDER_SITE_OTHER): Payer: Self-pay | Admitting: Family Medicine

## 2020-05-30 ENCOUNTER — Encounter: Payer: Self-pay | Admitting: Family Medicine

## 2020-05-30 ENCOUNTER — Other Ambulatory Visit: Payer: Self-pay

## 2020-05-30 VITALS — BP 110/80 | HR 88 | Ht 73.0 in | Wt 245.0 lb

## 2020-05-30 DIAGNOSIS — Z7689 Persons encountering health services in other specified circumstances: Secondary | ICD-10-CM

## 2020-05-30 NOTE — Progress Notes (Signed)
Date:  05/30/2020   Name:  Dakota Perez   DOB:  [birth year]   MRN:  [id number]   Chief Complaint: Establish Care (Was seeing [facility] PCP and had not been seen in 3 years. )  Patient is a 25 year old male who presents for a establish exam. The patient reports the following problems: new onset seizure disorder. Health maintenance has been reviewed up to date.   Lab Results  Component Value Date   CREATININE 1.01 04/10/2020   BUN 12 04/10/2020   NA 137 04/10/2020   K 4.4 04/10/2020   CL 101 04/10/2020   CO2 28 04/10/2020   No results found for: CHOL, HDL, LDLCALC, LDLDIRECT, TRIG, CHOLHDL Lab Results  Component Value Date   TSH 2.23 04/10/2020   No results found for: HGBA1C Lab Results  Component Value Date   WBC 8.3 04/10/2020   HGB 14.8 04/10/2020   HCT 44.1 04/10/2020   MCV 80.2 04/10/2020   PLT 303.0 04/10/2020   Lab Results  Component Value Date   ALT 75 (H) 04/10/2020   AST 38 (H) 04/10/2020   ALKPHOS 65 04/10/2020   BILITOT 0.5 04/10/2020     Review of Systems  Constitutional: Negative for chills and fever.  HENT: Negative for drooling, ear discharge, ear pain and sore throat.   Respiratory: Negative for cough, shortness of breath and wheezing.   Cardiovascular: Negative for chest pain, palpitations and leg swelling.  Gastrointestinal: Negative for abdominal pain, blood in stool, constipation, diarrhea and nausea.  Endocrine: Negative for polydipsia.  Genitourinary: Negative for dysuria, frequency, hematuria and urgency.  Musculoskeletal: Negative for back pain, myalgias and neck pain.  Skin: Negative for rash.  Allergic/Immunologic: Negative for environmental allergies.  Neurological: Negative for dizziness and headaches.  Hematological: Does not bruise/bleed easily.  Psychiatric/Behavioral: Negative for suicidal ideas. The patient is not nervous/anxious.     There are no problems to display for this patient.   No Known Allergies  Past Surgical  History:  Procedure Laterality Date  . decreased hearing/ Left side only    . INNER EAR SURGERY      Social History   Tobacco Use  . Smoking status: Never Smoker  . Smokeless tobacco: Never Used  Vaping Use  . Vaping Use: Never used  Substance Use Topics  . Alcohol use: Never  . Drug use: Never     Medication list has been reviewed and updated.  Current Meds  Medication Sig  . lamoTRIgine (LAMICTAL) 25 MG tablet Take 1 tab twice a day for 2 weeks, then increase to 2 tablets twice a day (Patient taking differently: 2 tablets twice a day/ [place] [facility])  . Multiple Vitamins-Minerals (MULTIVITAMIN ADULT PO) Take 2 tablets by mouth daily.    PHQ 2/9 Scores 05/30/2020  PHQ - 2 Score 1  PHQ- 9 Score 4    GAD 7 : Generalized Anxiety Score 05/30/2020  Nervous, Anxious, on Edge 1  Control/stop worrying 1  Worry too much - different things 1  Trouble relaxing 0  Restless 0  Easily annoyed or irritable 0  Afraid - awful might happen 2  Total GAD 7 Score 5  Anxiety Difficulty Not difficult at all    BP Readings from Last 3 Encounters:  05/30/20 110/80  04/10/20 129/87  03/04/20 121/83    Physical Exam Vitals and nursing note reviewed.  HENT:     Head: Normocephalic.     Right Ear: Tympanic membrane, ear canal and external ear  normal. There is no impacted cerumen.     Left Ear: Tympanic membrane, ear canal and external ear normal. There is no impacted cerumen.     Nose: Nose normal. No congestion or rhinorrhea.     Mouth/Throat:     Mouth: Oropharynx is clear and moist. Mucous membranes are moist.  Eyes:     General: No scleral icterus.       Right eye: No discharge.        Left eye: No discharge.     Extraocular Movements: EOM normal.     Conjunctiva/sclera: Conjunctivae normal.     Pupils: Pupils are equal, round, and reactive to light.  Neck:     Thyroid: No thyromegaly.     Vascular: No JVD.     Trachea: No tracheal deviation.  Cardiovascular:     Rate and  Rhythm: Normal rate and regular rhythm.     Pulses: Intact distal pulses.     Heart sounds: Normal heart sounds. No murmur heard. No friction rub. No gallop.   Pulmonary:     Effort: No respiratory distress.     Breath sounds: Normal breath sounds. No wheezing, rhonchi or rales.  Abdominal:     General: Bowel sounds are normal.     Palpations: Abdomen is soft. There is no hepatosplenomegaly or mass.     Tenderness: There is no abdominal tenderness. There is no CVA tenderness, guarding or rebound.  Musculoskeletal:        General: No tenderness or edema. Normal range of motion.     Cervical back: Normal range of motion and neck supple.  Lymphadenopathy:     Cervical: No cervical adenopathy.  Skin:    General: Skin is warm.     Capillary Refill: Capillary refill takes less than 2 seconds.     Findings: No rash.  Neurological:     Mental Status: He is alert and oriented to person, place, and time.     Cranial Nerves: No cranial nerve deficit.     Deep Tendon Reflexes: Strength normal and reflexes are normal and symmetric.     Wt Readings from Last 3 Encounters:  05/30/20 245 lb (111.1 kg)  04/10/20 253 lb (114.8 kg)  03/04/20 257 lb 3.2 oz (116.7 kg)    BP 110/80   Pulse 88   Ht 6\' 1"  (1.854 m)   Wt 245 lb (111.1 kg)   BMI 32.32 kg/m   Assessment and Plan: 1. Establishing care with new doctor, encounter for Patient presents to establish care with new physician.  Patient's has no subjective objective concerns at this time and is being followed by neurology for new onset seizure disorder.  Patient's chart was reviewed for previous encounters, most recent labs and liver function test that were done as far back as 2016 and 2013.  Patient is overweight and we prescribed a low triglyceride/cholesterol sheet for preparation of when we see him in approximately 3 months for physical.

## 2020-05-30 NOTE — Patient Instructions (Signed)

## 2020-06-24 IMAGING — MR MRI HEAD WITHOUT CONTRAST
12 of 13 series · 44 of 48 positions shown · non-contrast
Comparison: None.

CLINICAL DATA: Seizure, new, nontraumatic.

EXAM:
MRI HEAD WITHOUT CONTRAST
TECHNIQUE: Multiplanar, multiecho pulse sequences of the brain and surrounding
structures were obtained without intravenous contrast.

[Series 5: DWI · axial · 3.0mm · 0.88mm/px · z∈[-44,+112]mm · 8 of 108 slices shown (1 of 4)]
[im 1/108]
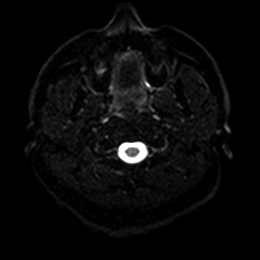
[im 16/108]
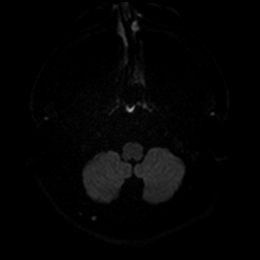
[im 31/108]
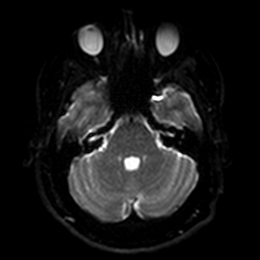
[im 46/108]
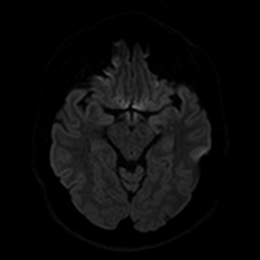
[im 62/108]
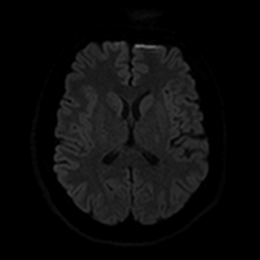
[im 77/108]
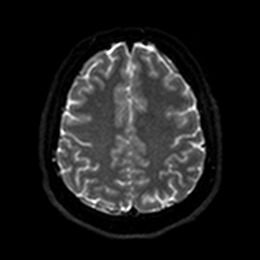
[im 92/108]
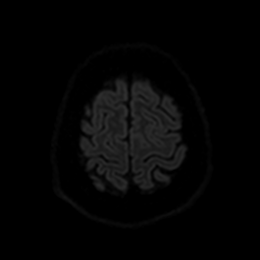
[im 108/108]
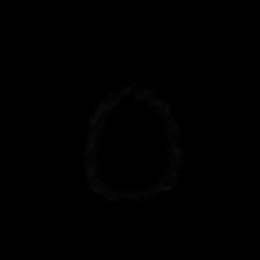

[Series 6: DWI · axial · 3.0mm · 0.88mm/px · z∈[-44,+112]mm · 4 of 54 slices shown (2 of 4)]
[im 1/54]
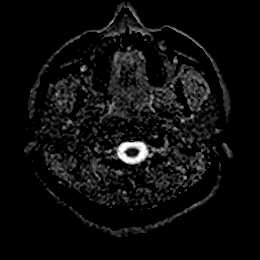
[im 18/54]
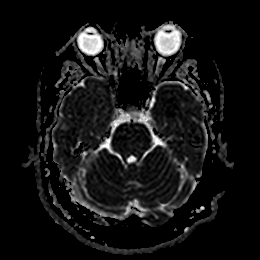
[im 36/54]
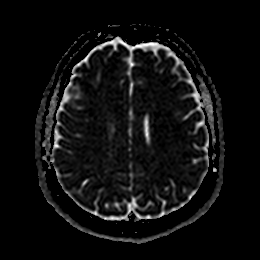
[im 54/54]
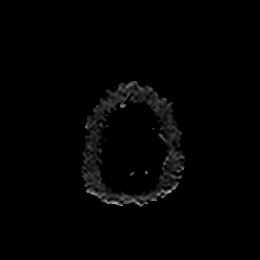

[Series 7: DWI · coronal · 4.0mm · 0.88mm/px · 6 of 76 slices shown (3 of 4)]
[im 1/76]
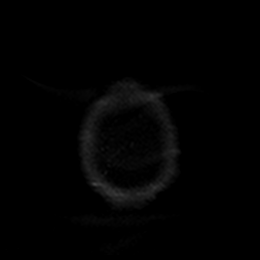
[im 16/76]
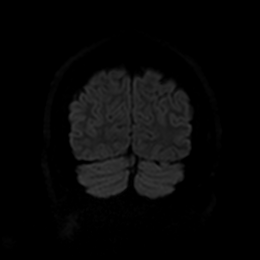
[im 31/76]
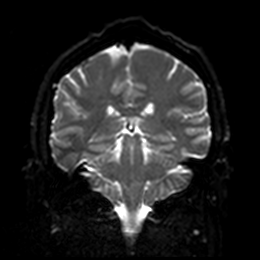
[im 46/76]
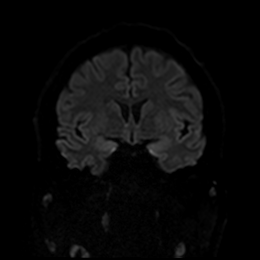
[im 61/76]
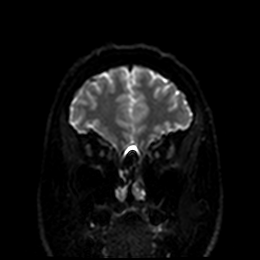
[im 76/76]
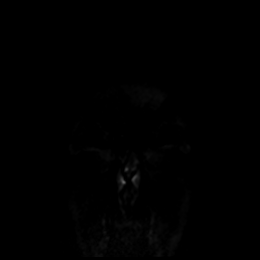

[Series 8: DWI · coronal · 4.0mm · 0.88mm/px · 3 of 38 slices shown (4 of 4)]
[im 1/38]
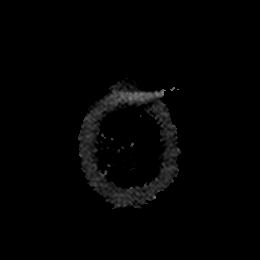
[im 19/38]
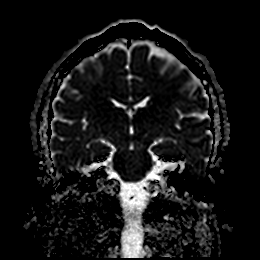
[im 38/38]
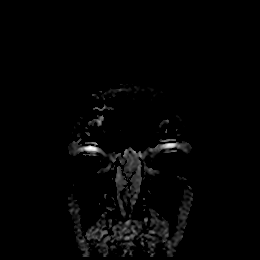

[Series 9: T1 · sagittal · 5.0mm · 0.78mm/px · 2 of 23 slices shown]
[im 1/23]
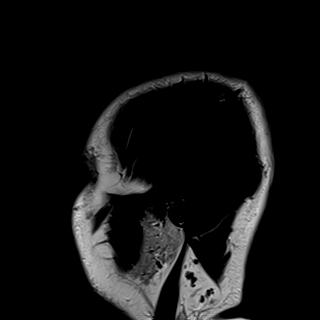
[im 23/23]
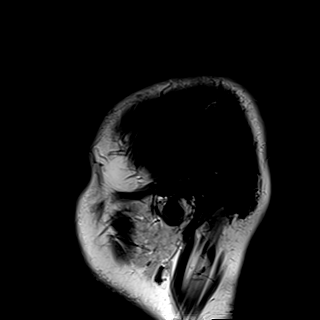

[Series 10: T2 · axial · 5.0mm · 0.72mm/px · z∈[-42,+99]mm · 2 of 25 slices shown (1 of 2)]
[im 1/25]
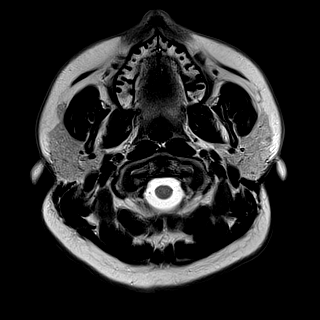
[im 25/25]
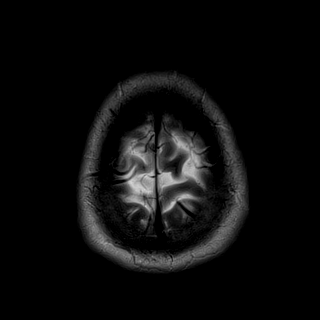

[Series 11: mag_images · axial · 3.0mm · 0.90mm/px · z∈[-47,+126]mm · 4 of 60 slices shown]
[im 1/60]
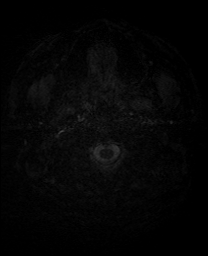
[im 20/60]
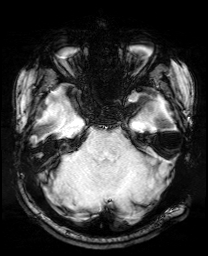
[im 40/60]
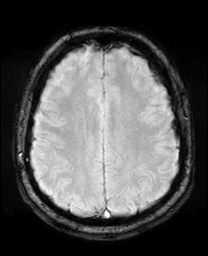
[im 60/60]
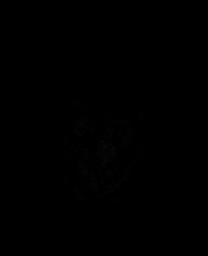

[Series 13: swi_images · axial · 3.0mm · 0.90mm/px · z∈[-47,+126]mm · 4 of 60 slices shown]
[im 1/60]
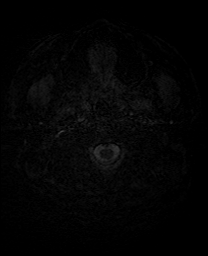
[im 20/60]
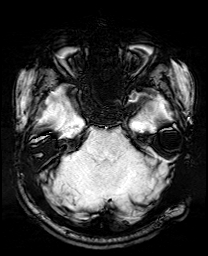
[im 40/60]
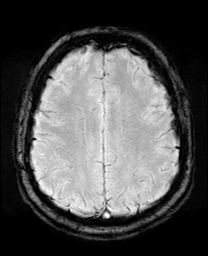
[im 60/60]
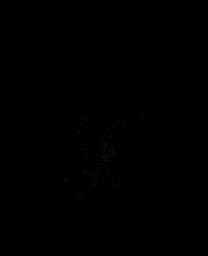

[Series 14: mip_images(sw) · axial · 24.0mm · 0.90mm/px · z∈[-37,+115]mm · 4 of 53 slices shown]
[im 1/53]
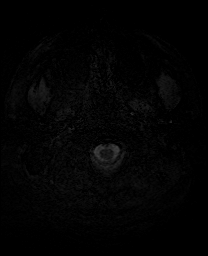
[im 18/53]
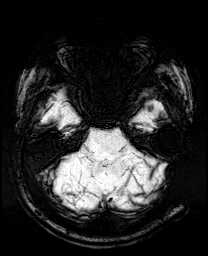
[im 35/53]
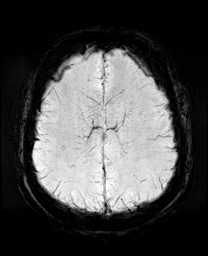
[im 53/53]
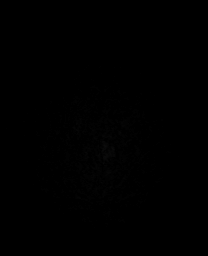

[Series 16: FLAIR · axial · 5.0mm · 0.45mm/px · z∈[-39,+102]mm · 2 of 25 slices shown (1 of 2)]
[im 1/25]
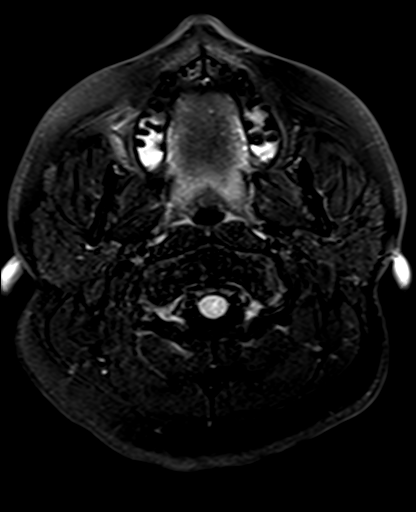
[im 25/25]
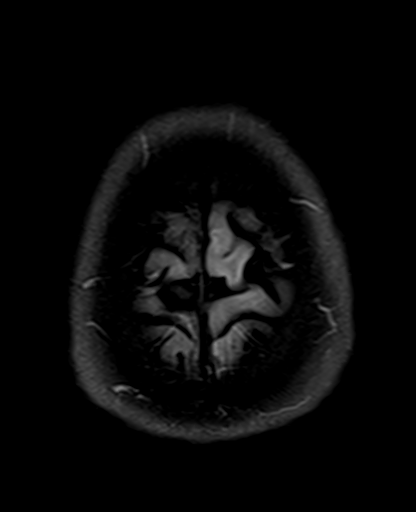

[Series 17: FLAIR · coronal · 3.0mm · 0.59mm/px · 3 of 36 slices shown (2 of 2)]
[im 1/36]
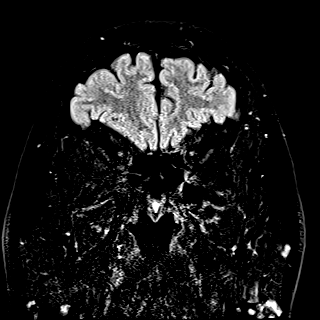
[im 18/36]
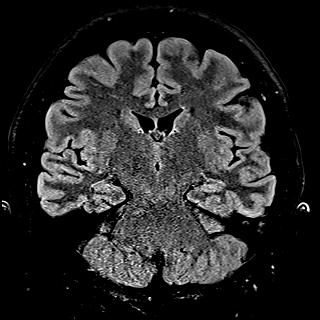
[im 36/36]
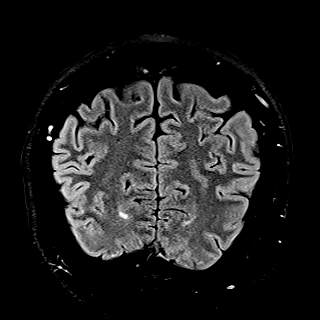

[Series 19: T2 · coronal · 5.0mm · 0.34mm/px · 2 of 29 slices shown (2 of 2)]
[im 1/29]
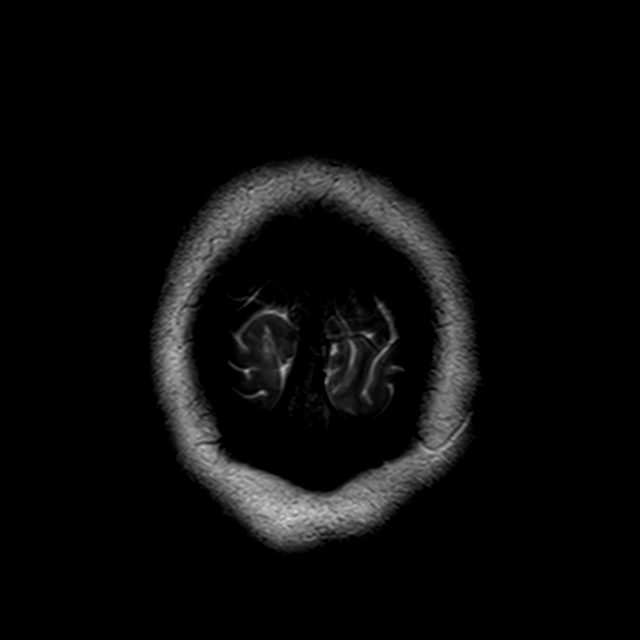
[im 29/29]
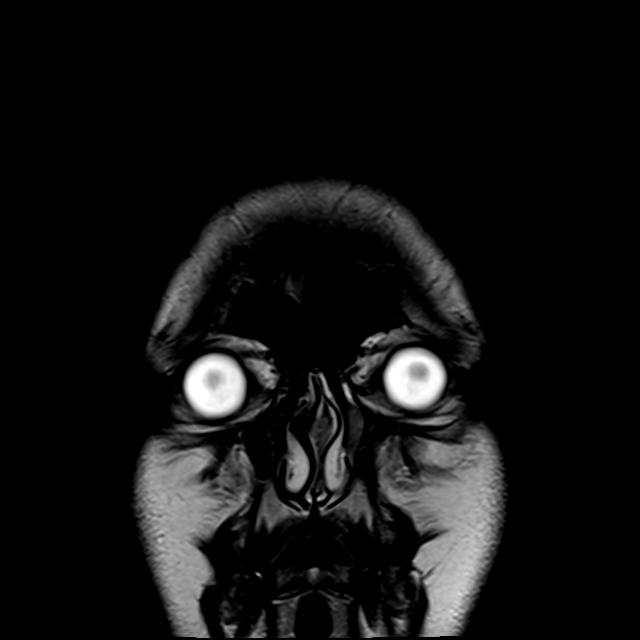

[44 of 48 positions shown; findings below may reference images not displayed]

FINDINGS: Brain: No cortical finding to explain seizure. No swelling,
infarction, hemorrhage, hydrocephalus, extra-axial collection or
mass lesion.

Vascular: Normal flow voids.

Skull and upper cervical spine: Normal marrow signal.

Sinuses/Orbits: Left retromastoid scarring, question prior
mastoidectomy.
IMPRESSION: Normal brain MRI.

## 2020-07-09 ENCOUNTER — Other Ambulatory Visit: Payer: Self-pay

## 2020-07-09 ENCOUNTER — Encounter: Payer: Self-pay | Admitting: Neurology

## 2020-07-09 ENCOUNTER — Ambulatory Visit (INDEPENDENT_AMBULATORY_CARE_PROVIDER_SITE_OTHER): Payer: Self-pay | Admitting: Neurology

## 2020-07-09 VITALS — BP 115/70 | HR 90 | Ht 73.0 in | Wt 246.4 lb

## 2020-07-09 DIAGNOSIS — R569 Unspecified convulsions: Secondary | ICD-10-CM

## 2020-07-09 MED ORDER — LAMOTRIGINE 100 MG PO TABS: 100.0000 mg | ORAL_TABLET | Freq: Two times a day (BID) | ORAL | 3 refills | Status: DC
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
## 2020-08-28 ENCOUNTER — Ambulatory Visit (INDEPENDENT_AMBULATORY_CARE_PROVIDER_SITE_OTHER): Payer: Self-pay | Admitting: Family Medicine

## 2020-08-28 ENCOUNTER — Other Ambulatory Visit: Payer: Self-pay

## 2020-08-28 ENCOUNTER — Encounter: Payer: Self-pay | Admitting: Family Medicine

## 2020-08-28 VITALS — BP 120/80 | HR 64 | Ht 73.0 in | Wt 248.0 lb

## 2020-08-28 DIAGNOSIS — Z6830 Body mass index (BMI) 30.0-30.9, adult: Secondary | ICD-10-CM

## 2020-08-28 DIAGNOSIS — Z0000 Encounter for general adult medical examination without abnormal findings: Secondary | ICD-10-CM

## 2020-08-28 DIAGNOSIS — L03116 Cellulitis of left lower limb: Secondary | ICD-10-CM

## 2020-08-28 LAB — POCT URINALYSIS DIPSTICK
Bilirubin, UA: NEGATIVE
Blood, UA: NEGATIVE
Glucose, UA: NEGATIVE
Ketones, UA: NEGATIVE
Leukocytes, UA: NEGATIVE
Nitrite, UA: NEGATIVE
Protein, UA: POSITIVE — AB
Spec Grav, UA: >=1.03 — AB (ref 1.010–1.025)
Urobilinogen, UA: 0.2 E.U./dL
pH, UA: 6 (ref 5.0–8.0)

## 2020-08-28 NOTE — Patient Instructions (Signed)

## 2020-08-28 NOTE — Progress Notes (Signed)
Date:  08/28/2020   Name:  Dakota Perez   DOB:  December 30, 1995   MRN:  [id number]   Chief Complaint: Annual Exam  Patient is a 25 year old male who presents for a comprehensive physical exam. The patient reports the following problems: tick bite. Health maintenance has been reviewed up to date.   Lab Results  Component Value Date   CREATININE 1.01 04/10/2020   BUN 12 04/10/2020   NA 137 04/10/2020   K 4.4 04/10/2020   CL 101 04/10/2020   CO2 28 04/10/2020   No results found for: CHOL, HDL, LDLCALC, LDLDIRECT, TRIG, CHOLHDL Lab Results  Component Value Date   TSH 2.23 04/10/2020   No results found for: HGBA1C Lab Results  Component Value Date   WBC 8.3 04/10/2020   HGB 14.8 04/10/2020   HCT 44.1 04/10/2020   MCV 80.2 04/10/2020   PLT 303.0 04/10/2020   Lab Results  Component Value Date   ALT 75 (H) 04/10/2020   AST 38 (H) 04/10/2020   ALKPHOS 65 04/10/2020   BILITOT 0.5 04/10/2020     Review of Systems  Constitutional: Negative for chills and fever.  HENT: Negative for drooling, ear discharge, ear pain and sore throat.   Respiratory: Negative for cough, shortness of breath and wheezing.   Cardiovascular: Negative for chest pain, palpitations and leg swelling.  Gastrointestinal: Negative for abdominal pain, blood in stool, constipation, diarrhea and nausea.  Endocrine: Negative for polydipsia.  Genitourinary: Negative for dysuria, frequency, hematuria and urgency.  Musculoskeletal: Negative for arthralgias, back pain, myalgias and neck pain.  Skin: Negative for rash.  Allergic/Immunologic: Negative for environmental allergies.  Neurological: Negative for dizziness and headaches.  Hematological: Does not bruise/bleed easily.  Psychiatric/Behavioral: Negative for suicidal ideas. The patient is not nervous/anxious.     There are no problems to display for this patient.   No Known Allergies  Past Surgical History:  Procedure Laterality Date  . decreased  hearing/ Left side only    . INNER EAR SURGERY      Social History   Tobacco Use  . Smoking status: Never Smoker  . Smokeless tobacco: Never Used  Vaping Use  . Vaping Use: Never used  Substance Use Topics  . Alcohol use: Never  . Drug use: Never     Medication list has been reviewed and updated.  Current Meds  Medication Sig  . lamoTRIgine (LAMICTAL) 100 MG tablet Take 1 tablet (100 mg total) by mouth 2 (two) times daily.  . Multiple Vitamins-Minerals (MULTIVITAMIN ADULT PO) Take 2 tablets by mouth daily.    PHQ 2/9 Scores 08/28/2020 05/30/2020  PHQ - 2 Score 0 1  PHQ- 9 Score 0 4    GAD 7 : Generalized Anxiety Score 08/28/2020 05/30/2020  Nervous, Anxious, on Edge 0 1  Control/stop worrying 0 1  Worry too much - different things 0 1  Trouble relaxing 0 0  Restless 0 0  Easily annoyed or irritable 0 0  Afraid - awful might happen 0 2  Total GAD 7 Score 0 5  Anxiety Difficulty - Not difficult at all    BP Readings from Last 3 Encounters:  08/28/20 120/80  07/09/20 115/70  05/30/20 110/80    Physical Exam Vitals and nursing note reviewed.  Constitutional:      Appearance: Normal appearance. He is well-groomed. He is obese.  HENT:     Head: Normocephalic.     Jaw: There is normal jaw occlusion.  Right Ear: Tympanic membrane, ear canal and external ear normal. There is no impacted cerumen.     Left Ear: Tympanic membrane, ear canal and external ear normal. There is no impacted cerumen.     Nose: Nose normal. No congestion.     Right Turbinates: Not enlarged or swollen.     Left Turbinates: Not enlarged or swollen.     Mouth/Throat:     Lips: Pink.     Mouth: Mucous membranes are moist.     Dentition: Normal dentition.     Tongue: No lesions.     Palate: No mass.     Pharynx: Oropharynx is clear. Uvula midline.     Tonsils: No tonsillar exudate.  Eyes:     General: Lids are normal. Vision grossly intact. Gaze aligned appropriately. No scleral icterus.        Right eye: No discharge.        Left eye: No discharge.     Extraocular Movements: Extraocular movements intact.     Conjunctiva/sclera: Conjunctivae normal.     Pupils: Pupils are equal, round, and reactive to light.     Funduscopic exam:    Right eye: Red reflex present.        Left eye: Red reflex present. Neck:     Thyroid: No thyroid mass, thyromegaly or thyroid tenderness.     Vascular: Normal carotid pulses. No carotid bruit, hepatojugular reflux or JVD.     Trachea: Trachea and phonation normal. No tracheal deviation.  Cardiovascular:     Rate and Rhythm: Normal rate and regular rhythm.     Chest Wall: PMI is not displaced.     Pulses: Normal pulses.          Carotid pulses are 2+ on the right side and 2+ on the left side.      Radial pulses are 2+ on the right side and 2+ on the left side.       Femoral pulses are 2+ on the right side and 2+ on the left side.      Popliteal pulses are 2+ on the right side and 2+ on the left side.       Dorsalis pedis pulses are 2+ on the right side and 2+ on the left side.       Posterior tibial pulses are 2+ on the right side and 2+ on the left side.     Heart sounds: Normal heart sounds, S1 normal and S2 normal. No murmur heard.  No systolic murmur is present.  No diastolic murmur is present. No friction rub. No gallop. No S3 or S4 sounds.   Pulmonary:     Effort: Pulmonary effort is normal. No respiratory distress.     Breath sounds: Normal breath sounds and air entry. No decreased breath sounds, wheezing, rhonchi or rales.  Chest:     Chest wall: No mass.  Breasts: Breasts are symmetrical.     Right: Normal. No swelling, bleeding, inverted nipple, mass, nipple discharge, skin change, tenderness, axillary adenopathy or supraclavicular adenopathy.     Left: Normal. No swelling, bleeding, inverted nipple, mass, nipple discharge, skin change, tenderness, axillary adenopathy or supraclavicular adenopathy.    Abdominal:     General: Bowel  sounds are normal.     Palpations: Abdomen is soft. There is no hepatomegaly, splenomegaly or mass.     Tenderness: There is no abdominal tenderness. There is no right CVA tenderness, left CVA tenderness, guarding or rebound.  Genitourinary:  Penis: Uncircumcised.      Testes: Normal.        Right: Mass or tenderness not present.        Left: Tenderness not present.     Epididymis:     Right: Normal.     Left: Normal.  Musculoskeletal:        General: No tenderness. Normal range of motion.     Cervical back: Normal, full passive range of motion without pain, normal range of motion and neck supple.     Thoracic back: Normal.     Lumbar back: Normal.     Right lower leg: No edema.     Left lower leg: No edema.  Lymphadenopathy:     Head:     Right side of head: No submental, submandibular or tonsillar adenopathy.     Left side of head: No submental, submandibular or tonsillar adenopathy.     Cervical: No cervical adenopathy.     Right cervical: No superficial, deep or posterior cervical adenopathy.    Left cervical: No superficial, deep or posterior cervical adenopathy.     Upper Body:     Right upper body: No supraclavicular or axillary adenopathy.     Left upper body: No supraclavicular or axillary adenopathy.  Skin:    General: Skin is warm.     Capillary Refill: Capillary refill takes less than 2 seconds.     Findings: No rash.  Neurological:     Mental Status: He is alert and oriented to person, place, and time.     Cranial Nerves: Cranial nerves are intact. No cranial nerve deficit.     Sensory: Sensation is intact.     Motor: Motor function is intact.     Deep Tendon Reflexes: Reflexes are normal and symmetric.     Reflex Scores:      Tricep reflexes are 2+ on the right side and 2+ on the left side.      Bicep reflexes are 2+ on the right side and 2+ on the left side.      Brachioradialis reflexes are 2+ on the right side and 2+ on the left side.      Patellar  reflexes are 2+ on the right side and 2+ on the left side.      Achilles reflexes are 2+ on the right side and 2+ on the left side. Psychiatric:        Behavior: Behavior is cooperative.     Wt Readings from Last 3 Encounters:  08/28/20 248 lb (112.5 kg)  07/09/20 246 lb 6.4 oz (111.8 kg)  05/30/20 245 lb (111.1 kg)    BP 120/80   Pulse 64   Ht 6\' 1"  (1.854 m)   Wt 248 lb (112.5 kg)   BMI 32.72 kg/m   Assessment and Plan: Dakota Perez is a 25 y.o. male who presents today for his Complete Annual Exam. He feels well. He reports exercising . He reports he is sleeping well.  Patient's chart was reviewed for previous encounters most recent labs and most recent imaging as well as care everywhere.  1. Annual physical exam Immunizations are reviewed and recommendations provided.   Age appropriate screening tests are discussed. Counseling given for risk factor reduction interventions.  There is no subjective/objective concerns noted during history and physical exam of them noted.  We will obtain a point-of-care urinalysis as well as lipid and renal function. - Lipid Panel With LDL/HDL Ratio - Renal Function Panel - POCT urinalysis  dipstick  2. Cellulitis of left lower extremity And has a small superficial area of cellulitis of the left lower thigh consistent with a small insect bite presumably of a tick nature.  Area was cleaned with alcohol and area was debrided with superficial 15 blade.  Dressing with triple antibiotic ointment was done.  3. BMI 30.0-30.9,adult Health risks of being over weight were discussed and patient was counseled on weight loss options and exercise.  Patient was given decreased lipid diet for anticipated elevated LDL.

## 2020-08-29 LAB — RENAL FUNCTION PANEL
Albumin: 5 g/dL (ref 4.1–5.2)
BUN/Creatinine Ratio: 12 (ref 9–20)
BUN: 13 mg/dL (ref 6–20)
CO2: 23 mmol/L (ref 20–29)
Calcium: 10.2 mg/dL (ref 8.7–10.2)
Chloride: 100 mmol/L (ref 96–106)
Creatinine, Ser: 1.1 mg/dL (ref 0.76–1.27)
Glucose: 90 mg/dL (ref 65–99)
Phosphorus: 4.3 mg/dL — ABNORMAL HIGH (ref 2.8–4.1)
Potassium: 4.3 mmol/L (ref 3.5–5.2)
Sodium: 141 mmol/L (ref 134–144)
eGFR: 97 mL/min/{1.73_m2} (ref >59)

## 2020-08-29 LAB — LIPID PANEL WITH LDL/HDL RATIO
Cholesterol, Total: 258 mg/dL — ABNORMAL HIGH (ref 100–199)
HDL: 40 mg/dL (ref >39)
LDL Chol Calc (NIH): 160 mg/dL — ABNORMAL HIGH (ref 0–99)
LDL/HDL Ratio: 4 ratio — ABNORMAL HIGH (ref 0.0–3.6)
Triglycerides: 310 mg/dL — ABNORMAL HIGH (ref 0–149)
VLDL Cholesterol Cal: 58 mg/dL — ABNORMAL HIGH (ref 5–40)

## 2020-10-22 ENCOUNTER — Ambulatory Visit (INDEPENDENT_AMBULATORY_CARE_PROVIDER_SITE_OTHER): Payer: Self-pay | Admitting: Neurology

## 2020-10-22 ENCOUNTER — Encounter: Payer: Self-pay | Admitting: Neurology

## 2020-10-22 ENCOUNTER — Other Ambulatory Visit: Payer: Self-pay

## 2020-10-22 VITALS — BP 133/84 | HR 94 | Ht 73.0 in | Wt 246.0 lb

## 2020-10-22 DIAGNOSIS — R569 Unspecified convulsions: Secondary | ICD-10-CM

## 2020-10-22 DIAGNOSIS — R29818 Other symptoms and signs involving the nervous system: Secondary | ICD-10-CM

## 2020-10-22 MED ORDER — LAMOTRIGINE 100 MG PO TABS: 100.0000 mg | ORAL_TABLET | Freq: Two times a day (BID) | ORAL | 3 refills | Status: DC
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
## 2020-11-29 ENCOUNTER — Ambulatory Visit: Payer: Medicaid Other | Admitting: Neurology

## 2021-01-28 ENCOUNTER — Encounter: Payer: Self-pay | Admitting: Neurology

## 2021-01-28 ENCOUNTER — Ambulatory Visit (INDEPENDENT_AMBULATORY_CARE_PROVIDER_SITE_OTHER): Payer: Self-pay | Admitting: Neurology

## 2021-01-28 ENCOUNTER — Other Ambulatory Visit: Payer: Self-pay

## 2021-01-28 VITALS — BP 116/71 | HR 94 | Ht 73.0 in | Wt 250.4 lb

## 2021-01-28 DIAGNOSIS — F419 Anxiety disorder, unspecified: Secondary | ICD-10-CM

## 2021-01-28 DIAGNOSIS — R569 Unspecified convulsions: Secondary | ICD-10-CM

## 2021-01-28 MED ORDER — SERTRALINE HCL 25 MG PO TABS: ORAL_TABLET | ORAL | 11 refills | Status: DC
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
## 2021-01-28 NOTE — Progress Notes (Signed)
NEUROLOGY FOLLOW UP OFFICE NOTE  Marteze Vecchio [id number] [birth year]  HISTORY OF PRESENT ILLNESS: I had the pleasure of seeing Diane Mochizuki in follow-up in the neurology clinic on 01/28/2021.  The patient was last seen 3 months ago for seizure. He had a convulsion in 11/2018 followed by recurrent episodes of sensory symptoms with some nausea and disorientation. He would have a flutter in his chest, tingling in his head, nausea, when more severe it is hard to do anything for 15-30 minutes. MRI brain and 48-hour EEG were normal. He was started on Lamotrigine empirically, currently on 100mg  BID. He was previously reporting daily symptoms that had significantly reduced in frequently, tending to occur when he is in public situations. We reviewed his calendar today. Since classes restarted, there has been an in frequency. He was having them daily in July, this was during finals week. Fall semester started and he has symptoms 4-5 times a week where he would feel a sensation in his stomach, tingling on the right side of his face. Symptoms are at its worst when traveling to school, he notices it is less when he is going back home. It gets pretty bad during an exam, he can still complete the exam but may feel a little disoriented. He has had them at home while watching his pets, but is not sure if there is some relation to stress. He would be sitting at his desk or in conversation and feel tingling on his face. He is taking Lamotrigine 100mg  BID without side effects. When he misses a dose, he feels a little strange. One time he woke up with his head feeling weird, "static-y." He does note the symptoms are better but still frequent. No convulsions since 11/2018.    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 He was on the plane coming back from vacation when  he started feeling nauseated, which he attributed to flight turbulence. He then started feeling a sensation across his forehead then his mother witnessed him become very rigid, arms drawn towards his chest with bilateral upper and lower extremity shaking. The episode lasted 40 seconds, his mother states he was confused after and had multiple episodes of vomiting. No tongue bite or incontinence. He recalls waking up in the plane feeling really tired and sleepy, he recalls answering questions. He was brought to [facility] where CBC, CMP, UDS were negative. I personally reviewed MRI brain without contrast which was normal, hippocampi symmetric with no abnormal signal seen. He reported he did not get a lot of sleep the night prior due to travel. No alcohol or drug intake. Afterwards, he recalls having similar sensations across his forehead when he shaved his head in September, but he is not sure. He would have the similar sort of small sensations very briefly, however over the past 2-3 weeks, they were more frequent and longer. He was at school last 11/1 when he started feeling the same sensation across his forehead, he walked out the classroom and started to feel a little nausea. He had to lay down and started feeling better after 10 minutes but he was not completely back to usual baseline so he went to the ER where exam was normal. Last Friday, he was feeling the sensation most of the day. He denies any head pain or clear dizziness. He can talk but feels that  speaking is a little difficult at times. When episodes are more intense, there is a small bit of nausea, no further vomiting similar to a year ago. He has noticed he is more sensitive to lights and some sounds, his eyes would feel overstimulated after working on the computer. Closing his eyes does not help, dimming the lights helps a little. He lives with his family and denies being told of any staring/unresponsive episodes. He denies any other gaps in time,  olfactory/gustatory hallucinations, deja vu, rising epigastric sensation, focal numbness/tingling/weakness, myoclonic jerks. He denies any diplopia, dysarthria/dysphagia, back pain, bowel/bladder dysfunction. Sometimes when laying in bed, he feels a sensation in the back of his head. He usually gets 8-10 hours of sleep but has some difficulty with sleep initiation. Memory is alright. He is in college at [employer] getting an Associate's degree in Fine Arts.   Epilepsy Risk Factors:  His deceased older brother had drug-related seizures. He had a normal birth and early development.  There is no history of febrile convulsions, CNS infections such as meningitis/encephalitis, significant traumatic brain injury, neurosurgical procedures.   Diagnostic Data: MRI brain without contrast 11/2018 was normal, hippocampi symmetric with no abnormal signal seen.  Routine EEG 02/2020 normal 48-hour EEG 02/2020 normal. Two episodes of mild weird sensation over his head did not show any EEG correlate.   PAST MEDICAL HISTORY: Past Medical History:  Diagnosis Date   History of seizure     MEDICATIONS: Current Outpatient Medications on File Prior to Visit  Medication Sig Dispense Refill   lamoTRIgine (LAMICTAL) 100 MG tablet Take 1 tablet (100 mg total) by mouth 2 (two) times daily. 180 tablet 3   Multiple Vitamins-Minerals (MULTIVITAMIN ADULT PO) Take 2 tablets by mouth daily.     No current facility-administered medications on file prior to visit.    ALLERGIES: No Known Allergies  FAMILY HISTORY: Family History  Problem Relation Age of Onset   Healthy Mother    Healthy Father     SOCIAL HISTORY: Social History   Socioeconomic History   Marital status: Single    Spouse name: Not on file   Number of children: Not on file   Years of education: Not on file   Highest education level: Not on file  Occupational History   Not on file  Tobacco Use   Smoking status: Never   Smokeless tobacco: Never   Vaping Use   Vaping Use: Never used  Substance and Sexual Activity   Alcohol use: Never   Drug use: Never   Sexual activity: Not Currently  Other Topics Concern   Not on file  Social History Narrative   Right handed    Lives with family    Drinks little caffeine    Social Determinants of Health   Financial Resource Strain: Not on file  Food Insecurity: Not on file  Transportation Needs: Not on file  Physical Activity: Not on file  Stress: Not on file  Social Connections: Not on file  Intimate Partner Violence: Not on file     PHYSICAL EXAM: Vitals:   01/28/21 1420  BP: 116/71  Pulse: 94  SpO2: 99%   General: No acute distress Head:  Normocephalic/atraumatic Skin/Extremities: No rash, no edema Neurological Exam: alert and awake. No aphasia or dysarthria. Fund of knowledge is appropriate.  Recent and remote memory are intact.  Attention and concentration are normal.   Cranial nerves: Pupils equal, round. Extraocular movements intact with no nystagmus. Visual fields full.  No facial asymmetry.  Motor: Bulk and tone normal, muscle strength 5/5 throughout with no pronator drift.   Finger to nose testing intact.  Gait narrow-based and steady, no ataxia   IMPRESSION: This is a pleasant 25 yo RH man with history of left inner ear surgery, new onset convulsion in 11/2018 that was preceded by nausea and a sensation across his forehead. He denies any further convulsions since 11/2018, but continues to have recurrent sensory symptoms where he describes a sensation in his head/disorientation. MRI brain and 48-hour EEG normal, typical events not captured. 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 We had discussed how anxiety can also cause similar symptoms, and since he is noticing them more with school/activities, we have agreed to start Sertraline 25mg  qhs and monitor symptoms with treatment of anxiety. Side  effects discussed. Continue symptom diary. He is aware of [place] driving laws to stop driving after an episode of loss of consciousness until 6 months event-free. Follow-up in 3-4 months, call for any changes.    Thank you for allowing me to participate in his care.  Please do not hesitate to call for any questions or concerns.    , M.D.   CC: Dr. 

## 2021-05-14 ENCOUNTER — Telehealth (INDEPENDENT_AMBULATORY_CARE_PROVIDER_SITE_OTHER): Payer: Self-pay | Admitting: Neurology

## 2021-05-14 ENCOUNTER — Encounter: Payer: Self-pay | Admitting: Neurology

## 2021-05-14 ENCOUNTER — Other Ambulatory Visit: Payer: Self-pay

## 2021-05-14 VITALS — Ht 73.0 in | Wt 260.0 lb

## 2021-05-14 DIAGNOSIS — F419 Anxiety disorder, unspecified: Secondary | ICD-10-CM

## 2021-05-14 DIAGNOSIS — R29818 Other symptoms and signs involving the nervous system: Secondary | ICD-10-CM

## 2021-05-14 DIAGNOSIS — R569 Unspecified convulsions: Secondary | ICD-10-CM

## 2021-05-14 MED ORDER — SERTRALINE HCL 25 MG PO TABS: ORAL_TABLET | ORAL | 11 refills | Status: DC

## 2021-05-14 MED ORDER — LAMOTRIGINE 100 MG PO TABS: ORAL_TABLET | ORAL | 3 refills | Status: DC

## 2021-05-14 NOTE — Patient Instructions (Signed)
Good to see you! Thank you for your patience!  Reduce Lamotrigine 100mg : Take 1/2 tablet in AM, 1 tablet in PM  2. Continue Sertraline 25mg  daily. Update me in a month how you are doing with reduction in Lamotrigine, if no issues, we can plan to increase Sertraline   3. Follow-up in 4-5 months, call for any changes   Seizure Precautions: 1. If medication has been prescribed for you to prevent seizures, take it exactly as directed.  Do not stop taking the medicine without talking to your doctor first, even if you have not had a seizure in a long time.   2. Avoid activities in which a seizure would cause danger to yourself or to others.  Don't operate dangerous machinery, swim alone, or climb in high or dangerous places, such as on ladders, roofs, or girders.  Do not drive unless your doctor says you may.  3. If you have any warning that you may have a seizure, lay down in a safe place where you can't hurt yourself.    4.  No driving for 6 months from last seizure, as per [facility].   Please refer to the following link on the Epilepsy Foundation of [employer]'s website for more information: http://www.epilepsyfoundation.org/answerplace/Social/driving/drivingu.cfm   5.  Maintain good sleep hygiene. Avoid alcohol.  6.  Contact your doctor if you have any problems that may be related to the medicine you are taking.  7.  Call 911 and bring the patient back to the ED if:        A.  The seizure lasts longer than 5 minutes.       B.  The patient doesn't awaken shortly after the seizure  C.  The patient has new problems such as difficulty seeing, speaking or moving  D.  The patient was injured during the seizure  E.  The patient has a temperature over 102 F (39C)  F.  The patient vomited and now is having trouble breathing

## 2021-05-14 NOTE — Progress Notes (Signed)
Virtual Visit via Video Note The purpose of this virtual visit is to provide medical care while limiting exposure to the novel coronavirus.    Consent was obtained for video visit:  Yes.   Answered questions that patient had about telehealth interaction:  Yes.   I discussed the limitations, risks, security and privacy concerns of performing an evaluation and management service by telemedicine. I also discussed with the patient that there may be a patient responsible charge related to this service. The patient expressed understanding and agreed to proceed.  Pt location: Home Physician Location: office Name of referring provider:  Duanne Limerick, MD I connected with Dakota Perez at patients initiation/request on 05/14/2021 at  1:00 PM EST by video enabled telemedicine application and verified that I am speaking with the correct person using two identifiers. Pt MRN:  [id number] Pt DOB:  02/08/1996 Video Participants:  Dakota Perez   History of Present Illness:  The patient had a virtual video visit on 05/14/2021. He was last seen 3 months ago for seizure. He had a convulsion in 11/2018 followed by recurrent episodes of sensory symptoms with some nausea and disorientation. He would have a flutter in his chest, tingling in his head, nausea, when more severe it is hard to do anything for 15-30 minutes. MRI brain and 48-hour EEG were normal. He was started on Lamotrigine empirically, currently on 100mg  BID. He was previously reporting daily symptoms that had significantly reduced in frequently, tending to occur when he is in public situations. We agreed to start Sertraline 25mg  daily on last visit, he feels that symptoms are less severe, and has noticed that 99% of the time they occur when he is going to college or going out with someone. They are not as disorienting but still distracting. One time it became more intense when he was waiting to present something, then quieted down and he had no problems  doing the presentation. He denies any staring/unresponsive episodes, gaps in time, olfactory/gustatory hallucinations, focal numbness/tingling/weakness, myoclonic jerks. No headaches, dizziness, vision changes, no falls. Sleep is good, he usually gets 7-8 hours of sleep.    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 He was on the plane coming back from vacation when he started feeling nauseated, which he attributed to flight turbulence. He then started feeling a sensation across his forehead then his mother witnessed him become very rigid, arms drawn towards his chest with bilateral upper and lower extremity shaking. The episode lasted 40 seconds, his mother states he was confused after and had multiple episodes of vomiting. No tongue bite or incontinence. He recalls waking up in the plane feeling really tired and sleepy, he recalls answering questions. He was brought to [facility] where CBC, CMP, UDS were negative. I personally reviewed MRI brain without contrast which was normal, hippocampi symmetric with no abnormal signal seen. He reported he did not get a lot of sleep the night prior due to travel. No alcohol or drug intake. Afterwards, he recalls having similar sensations across his forehead when he shaved his head in September, but he is not sure. He would have the similar sort of small sensations very briefly, however over the past 2-3 weeks, they were more frequent and longer. He was at school last 11/1 when he started feeling the same sensation across his forehead, he walked out the classroom  and started to feel a little nausea. He had to lay down and started feeling better after 10 minutes but he was not completely back to usual baseline so he went to the ER where exam was normal. Last Friday, he was feeling the sensation most of the day. He denies any  head pain or clear dizziness. He can talk but feels that speaking is a little difficult at times. When episodes are more intense, there is a small bit of nausea, no further vomiting similar to a year ago. He has noticed he is more sensitive to lights and some sounds, his eyes would feel overstimulated after working on the computer. Closing his eyes does not help, dimming the lights helps a little. He lives with his family and denies being told of any staring/unresponsive episodes. He denies any other gaps in time, olfactory/gustatory hallucinations, deja vu, rising epigastric sensation, focal numbness/tingling/weakness, myoclonic jerks. He denies any diplopia, dysarthria/dysphagia, back pain, bowel/bladder dysfunction. Sometimes when laying in bed, he feels a sensation in the back of his head. He usually gets 8-10 hours of sleep but has some difficulty with sleep initiation. Memory is alright. He is in college at [employer] getting an Associate's degree in Fine Arts.   Epilepsy Risk Factors:  His deceased older brother had drug-related seizures. He had a normal birth and early development.  There is no history of febrile convulsions, CNS infections such as meningitis/encephalitis, significant traumatic brain injury, neurosurgical procedures.   Diagnostic Data: MRI brain without contrast 11/2018 was normal, hippocampi symmetric with no abnormal signal seen.  Routine EEG 02/2020 normal 48-hour EEG 02/2020 normal. Two episodes of mild weird sensation over his head did not show any EEG correlate.    Current Outpatient Medications on File Prior to Visit  Medication Sig Dispense Refill   lamoTRIgine (LAMICTAL) 100 MG tablet Take 1 tablet (100 mg total) by mouth 2 (two) times daily. 180 tablet 3   sertraline (ZOLOFT) 25 MG tablet Take 1 tablet every night 30 tablet 11   No current facility-administered medications on file prior to visit.     Observations/Objective:   Vitals:   05/14/21 1238  Weight: 260 lb  (117.9 kg)  Height: 6\' 1"  (1.854 m)   GEN:  The patient appears stated age and is in NAD.  Neurological examination: Patient is awake, alert. No aphasia or dysarthria. Intact fluency and comprehension. Cranial nerves: Extraocular movements intact with no nystagmus. No facial asymmetry. Motor: moves all extremities symmetrically, at least anti-gravity x 4.    Assessment and Plan:   This is a pleasant 26 yo RH man with history of left inner ear surgery, new onset convulsion in 11/2018 that was preceded by nausea and a sensation across his forehead. He denies any further convulsions since 11/2018, but presented with recurrent sensory symptoms where he describes a sensation in his head/disorientation. MRI brain and 48-hour EEG normal, typical events not captured. Etiology unclear, simple partial seizures are still a consideration, he is on empiric treatment with Lamotrigine, however there has been reduction in severity with initiation of Sertraline. He is interested in reducing dose of Lamotrigine, we agreed to do one change at a time. Reduce Lamotrigine 100mg : Take 1/2 tablet in AM, 1 tab in PM. He will update our office in a month, if no issues, we can plan to increase Sertraline to 50mg  daily. He is aware of [place] driving laws to stop driving after an episode of loss of consciousness until 6 months event-free. Follow-up in 4-5  months, call for any changes.    Follow Up Instructions:   -I discussed the assessment and treatment plan with the patient. The patient was provided an opportunity to ask questions and all were answered. The patient agreed with the plan and demonstrated an understanding of the instructions.   The patient was advised to call back or seek an in-person evaluation if the symptoms worsen or if the condition fails to improve as anticipated.     Van ClinesKaren M Derinda Bartus, MD

## 2021-06-17 ENCOUNTER — Encounter: Payer: Self-pay | Admitting: Neurology

## 2021-09-01 ENCOUNTER — Ambulatory Visit (INDEPENDENT_AMBULATORY_CARE_PROVIDER_SITE_OTHER): Payer: Medicaid Other | Admitting: Family Medicine

## 2021-09-01 ENCOUNTER — Other Ambulatory Visit
Admission: RE | Admit: 2021-09-01 | Discharge: 2021-09-01 | Disposition: A | Discharge status: Home / Self Care | Payer: Medicaid Other | Attending: Family Medicine | Admitting: Family Medicine

## 2021-09-01 ENCOUNTER — Encounter: Payer: Self-pay | Admitting: Family Medicine

## 2021-09-01 VITALS — BP 124/70 | HR 84 | Ht 73.0 in | Wt 266.0 lb

## 2021-09-01 DIAGNOSIS — E669 Obesity, unspecified: Secondary | ICD-10-CM | POA: Insufficient documentation

## 2021-09-01 DIAGNOSIS — R195 Other fecal abnormalities: Secondary | ICD-10-CM | POA: Insufficient documentation

## 2021-09-01 DIAGNOSIS — K640 First degree hemorrhoids: Secondary | ICD-10-CM

## 2021-09-01 DIAGNOSIS — Z6830 Body mass index (BMI) 30.0-30.9, adult: Secondary | ICD-10-CM

## 2021-09-01 DIAGNOSIS — Z0000 Encounter for general adult medical examination without abnormal findings: Secondary | ICD-10-CM | POA: Diagnosis not present

## 2021-09-01 LAB — HEMOCCULT GUIAC POC 1CARD (OFFICE): Fecal Occult Blood, POC: POSITIVE — AB

## 2021-09-01 LAB — CREATININE, SERUM
Creatinine, Ser: 1.15 mg/dL (ref 0.61–1.24)
GFR, Estimated: >60 mL/min (ref >60)

## 2021-09-01 LAB — LDL CHOLESTEROL, DIRECT: Direct LDL: 154.9 mg/dL — ABNORMAL HIGH (ref 0–99)

## 2021-09-01 LAB — HEMOGLOBIN: Hemoglobin: 14.7 g/dL (ref 13.0–17.0)

## 2021-09-01 NOTE — Patient Instructions (Signed)

## 2021-09-01 NOTE — Progress Notes (Signed)
? ? ?Date:  09/01/2021  ? ?Name:  Dakota Perez   DOB:  07/19/1995   MRN:  [id number] ? ? ?Chief Complaint: Annual Exam ? ?Patient is a 26 year old male who presents for a comprehensive physical exam. The patient reports the following problems: none. Health maintenance has been reviewed up to date ?  ? ? ?Lab Results  ?Component Value Date  ? NA 141 08/28/2020  ? K 4.3 08/28/2020  ? CO2 23 08/28/2020  ? GLUCOSE 90 08/28/2020  ? BUN 13 08/28/2020  ? CREATININE 1.10 08/28/2020  ? CALCIUM 10.2 08/28/2020  ? EGFR 97 08/28/2020  ? GFRNONAA >60 12/16/2018  ? ?Lab Results  ?Component Value Date  ? CHOL 258 (H) 08/28/2020  ? HDL 40 08/28/2020  ? LDLCALC 160 (H) 08/28/2020  ? TRIG 310 (H) 08/28/2020  ? ?Lab Results  ?Component Value Date  ? TSH 2.23 04/10/2020  ? ?No results found for: HGBA1C ?Lab Results  ?Component Value Date  ? WBC 8.3 04/10/2020  ? HGB 14.8 04/10/2020  ? HCT 44.1 04/10/2020  ? MCV 80.2 04/10/2020  ? PLT 303.0 04/10/2020  ? ?Lab Results  ?Component Value Date  ? ALT 75 (H) 04/10/2020  ? AST 38 (H) 04/10/2020  ? ALKPHOS 65 04/10/2020  ? BILITOT 0.5 04/10/2020  ? ?No results found for: 25OHVITD2, 25OHVITD3, VD25OH  ? ?Review of Systems  ?Constitutional:  Negative for chills and fever.  ?HENT:  Negative for drooling, ear discharge, ear pain and sore throat.   ?Respiratory:  Negative for cough, shortness of breath and wheezing.   ?Cardiovascular:  Negative for chest pain, palpitations and leg swelling.  ?Gastrointestinal:  Negative for abdominal pain, blood in stool, constipation, diarrhea and nausea.  ?Endocrine: Negative for polydipsia.  ?Genitourinary:  Negative for dysuria, frequency, hematuria and urgency.  ?Musculoskeletal:  Negative for back pain, myalgias and neck pain.  ?Skin:  Negative for rash.  ?Allergic/Immunologic: Negative for environmental allergies.  ?Neurological:  Negative for dizziness and headaches.  ?Hematological:  Does not bruise/bleed easily.  ?Psychiatric/Behavioral:  Negative for  suicidal ideas. The patient is not nervous/anxious.   ? ?There are no problems to display for this patient. ? ? ?No Known Allergies ? ?Past Surgical History:  ?Procedure Laterality Date  ? decreased hearing/ Left side only    ? INNER EAR SURGERY    ? ? ?Social History  ? ?Tobacco Use  ? Smoking status: Never  ? Smokeless tobacco: Never  ?Vaping Use  ? Vaping Use: Never used  ?Substance Use Topics  ? Alcohol use: Never  ? Drug use: Never  ? ? ? ?Medication list has been reviewed and updated. ? ?Current Meds  ?Medication Sig  ? lamoTRIgine (LAMICTAL) 100 MG tablet Take 1/2 tablet in AM, 1 tablet in PM  ? sertraline (ZOLOFT) 25 MG tablet Take 1 tablet every night  ? ? ? ?  09/01/2021  ?  8:42 AM 08/28/2020  ?  8:51 AM 05/30/2020  ?  2:40 PM  ?GAD 7 : Generalized Anxiety Score  ?Nervous, Anxious, on Edge 2 0 1  ?Control/stop worrying 2 0 1  ?Worry too much - different things 3 0 1  ?Trouble relaxing 1 0 0  ?Restless 0 0 0  ?Easily annoyed or irritable 1 0 0  ?Afraid - awful might happen 1 0 2  ?Total GAD 7 Score 10 0 5  ?Anxiety Difficulty Somewhat difficult  Not difficult at all  ? ? ? ?  09/01/2021  ?    8:42 AM  ?Depression screen PHQ 2/9  ?Decreased Interest 2  ?Down, Depressed, Hopeless 2  ?PHQ - 2 Score 4  ?Altered sleeping 1  ?Tired, decreased energy 3  ?Change in appetite 0  ?Feeling bad or failure about yourself  2  ?Trouble concentrating 2  ?Moving slowly or fidgety/restless 0  ?Suicidal thoughts 0  ?PHQ-9 Score 12  ?Difficult doing work/chores Very difficult  ? ? ?BP Readings from Last 3 Encounters:  ?09/01/21 124/70  ?01/28/21 116/71  ?10/22/20 133/84  ? ? ?Physical Exam ?Vitals and nursing note reviewed.  ?Constitutional:   ?   Appearance: He is overweight.  ?HENT:  ?   Head: Normocephalic.  ?   Jaw: There is normal jaw occlusion.  ?   Right Ear: Hearing, tympanic membrane, ear canal and external ear normal. There is no impacted cerumen.  ?   Left Ear: Hearing, tympanic membrane, ear canal and external ear normal.  There is no impacted cerumen.  ?   Nose: Nose normal. No congestion or rhinorrhea.  ?   Mouth/Throat:  ?   Lips: Pink.  ?   Mouth: Mucous membranes are moist.  ?   Dentition: Normal dentition. No dental tenderness.  ?   Tongue: No lesions.  ?   Palate: No mass.  ?   Pharynx: Oropharynx is clear. Uvula midline.  ?Eyes:  ?   General: Lids are normal. Vision grossly intact. Gaze aligned appropriately. No scleral icterus.    ?   Right eye: No discharge.     ?   Left eye: No discharge.  ?   Extraocular Movements: Extraocular movements intact.  ?   Conjunctiva/sclera: Conjunctivae normal.  ?   Pupils: Pupils are equal, round, and reactive to light.  ?   Funduscopic exam: ?   Right eye: Red reflex present.     ?   Left eye: Red reflex present. ?Neck:  ?   Thyroid: No thyroid mass, thyromegaly or thyroid tenderness.  ?   Vascular: No JVD.  ?   Trachea: No tracheal deviation.  ?Cardiovascular:  ?   Rate and Rhythm: Normal rate and regular rhythm.  ?   Chest Wall: PMI is not displaced.  ?   Pulses:     ?     Carotid pulses are 2+ on the right side and 2+ on the left side. ?     Radial pulses are 2+ on the right side and 2+ on the left side.  ?     Femoral pulses are 2+ on the right side and 2+ on the left side. ?     Popliteal pulses are 2+ on the right side and 2+ on the left side.  ?     Dorsalis pedis pulses are 2+ on the right side and 2+ on the left side.  ?     Posterior tibial pulses are 2+ on the right side and 2+ on the left side.  ?   Heart sounds: Normal heart sounds, S1 normal and S2 normal. No murmur heard. ?No systolic murmur is present.  ?No diastolic murmur is present.  ?  No friction rub. No gallop. No S3 or S4 sounds.  ?Pulmonary:  ?   Effort: Pulmonary effort is normal. No respiratory distress.  ?   Breath sounds: Normal breath sounds. No decreased breath sounds, wheezing, rhonchi or rales.  ?Chest:  ?Breasts: ?   Right: Normal. No swelling, bleeding, inverted nipple or mass.  ?   Left:   Normal. No swelling,  bleeding, inverted nipple or mass.  ?Abdominal:  ?   General: Bowel sounds are normal.  ?   Palpations: Abdomen is soft. There is no hepatomegaly, splenomegaly or mass.  ?   Tenderness: There is no abdominal tenderness. There is no guarding or rebound.  ?   Hernia: No hernia is present. There is no hernia in the umbilical area, ventral area, left inguinal area or right inguinal area.  ?Genitourinary: ?   Testes: Normal.     ?   Right: Mass, tenderness or swelling not present.     ?   Left: Mass, tenderness or swelling not present.  ?Musculoskeletal:     ?   General: No tenderness. Normal range of motion.  ?   Cervical back: Normal, normal range of motion and neck supple.  ?   Thoracic back: Normal.  ?   Lumbar back: Normal.  ?   Right lower leg: No edema.  ?   Left lower leg: No edema.  ?Lymphadenopathy:  ?   Head:  ?   Right side of head: No submental or submandibular adenopathy.  ?   Left side of head: No submental or submandibular adenopathy.  ?   Cervical: No cervical adenopathy.  ?   Right cervical: No superficial, deep or posterior cervical adenopathy. ?   Left cervical: No superficial, deep or posterior cervical adenopathy.  ?   Upper Body:  ?   Right upper body: No supraclavicular, axillary or pectoral adenopathy.  ?   Left upper body: No supraclavicular, axillary or pectoral adenopathy.  ?Skin: ?   General: Skin is warm.  ?   Capillary Refill: Capillary refill takes less than 2 seconds.  ?   Findings: No rash.  ?Neurological:  ?   Mental Status: He is alert and oriented to person, place, and time.  ?   Cranial Nerves: No cranial nerve deficit.  ?   Deep Tendon Reflexes: Reflexes are normal and symmetric.  ?Psychiatric:     ?   Behavior: Behavior is cooperative.  ? ? ?Wt Readings from Last 3 Encounters:  ?09/01/21 266 lb (120.7 kg)  ?05/14/21 260 lb (117.9 kg)  ?01/28/21 250 lb 6.4 oz (113.6 kg)  ? ? ?BP 124/70   Pulse 84   Ht 6' 1" (1.854 m)   Wt 266 lb (120.7 kg)   BMI 35.09 kg/m?  ? ?Assessment and  Plan: ?Chrishawn Guice is a 26 y.o. male who presents today for his Complete Annual Exam. He feels well. He reports exercising . He reports he is sleeping well.  Patient's chart was reviewed for previous encounters mos

## 2021-09-03 ENCOUNTER — Ambulatory Visit (INDEPENDENT_AMBULATORY_CARE_PROVIDER_SITE_OTHER): Payer: Self-pay

## 2021-09-03 DIAGNOSIS — Z1211 Encounter for screening for malignant neoplasm of colon: Secondary | ICD-10-CM

## 2021-09-03 LAB — HEMOCCULT GUIAC POC 1CARD (OFFICE)
Card #2 Fecal Occult Blod, POC: NEGATIVE
Card #3 Fecal Occult Blood, POC: NEGATIVE
Fecal Occult Blood, POC: NEGATIVE

## 2021-09-11 ENCOUNTER — Encounter: Payer: Self-pay | Admitting: Neurology

## 2021-09-11 ENCOUNTER — Ambulatory Visit (INDEPENDENT_AMBULATORY_CARE_PROVIDER_SITE_OTHER): Payer: Self-pay | Admitting: Neurology

## 2021-09-11 ENCOUNTER — Encounter: Payer: Self-pay | Admitting: Family Medicine

## 2021-09-11 VITALS — BP 120/61 | HR 87 | Ht 73.0 in | Wt 265.0 lb

## 2021-09-11 DIAGNOSIS — R29818 Other symptoms and signs involving the nervous system: Secondary | ICD-10-CM

## 2021-09-11 DIAGNOSIS — R569 Unspecified convulsions: Secondary | ICD-10-CM

## 2021-09-11 DIAGNOSIS — F419 Anxiety disorder, unspecified: Secondary | ICD-10-CM
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
## 2021-09-11 NOTE — Patient Instructions (Signed)
Always good to see you.  Wean off Lamotrigine: take 1/2 tablet every night for 1 week, then stop  2. Continue to monitor symptoms on Sertraline 25mg  daily. If noticing an increase in frequency or intensity, we can increase dose.  3. Follow-up in 5-6 months, call for any changes.   Seizure Precautions: 1. If medication has been prescribed for you to prevent seizures, take it exactly as directed.  Do not stop taking the medicine without talking to your doctor first, even if you have not had a seizure in a long time.   2. Avoid activities in which a seizure would cause danger to yourself or to others.  Don't operate dangerous machinery, swim alone, or climb in high or dangerous places, such as on ladders, roofs, or girders.  Do not drive unless your doctor says you may.  3. If you have any warning that you may have a seizure, lay down in a safe place where you can't hurt yourself.    4.  No driving for 6 months from last seizure, as per [facility].   Please refer to the following link on the [facility] website for more information: http://[email or web address]/[email or web address]/[email or web address]   5.  Maintain good sleep hygiene. Avoid alcohol.  6.  Contact your doctor if you have any problems that may be related to the medicine you are taking.  7.  Call 911 and bring the patient back to the ED if:        A.  The seizure lasts longer than 5 minutes.       B.  The patient doesn't awaken shortly after the seizure  C.  The patient has new problems such as difficulty seeing, speaking or moving  D.  The patient was injured during the seizure  E.  The patient has a temperature over 102 F (39C)  F.  The patient vomited and now is having trouble breathing

## 2022-02-06 ENCOUNTER — Ambulatory Visit: Payer: BC Managed Care – PPO | Admitting: Neurology

## 2022-06-03 ENCOUNTER — Other Ambulatory Visit: Payer: Self-pay | Admitting: Neurology

## 2022-07-31 ENCOUNTER — Other Ambulatory Visit: Payer: Self-pay | Admitting: Neurology

## 2022-08-25 ENCOUNTER — Ambulatory Visit: Payer: BC Managed Care – PPO | Admitting: Neurology

## 2022-09-04 ENCOUNTER — Encounter: Payer: BC Managed Care – PPO | Admitting: Family Medicine

## 2022-09-06 ENCOUNTER — Other Ambulatory Visit: Payer: Self-pay | Admitting: Neurology

## 2022-09-14 ENCOUNTER — Encounter: Payer: Self-pay | Admitting: Neurology

## 2022-09-14 ENCOUNTER — Ambulatory Visit: Payer: BC Managed Care – PPO | Admitting: Neurology

## 2022-09-14 VITALS — BP 123/58 | HR 74 | Ht 73.0 in | Wt 256.0 lb

## 2022-09-14 DIAGNOSIS — F419 Anxiety disorder, unspecified: Secondary | ICD-10-CM | POA: Diagnosis not present

## 2022-09-14 DIAGNOSIS — R569 Unspecified convulsions: Secondary | ICD-10-CM

## 2022-09-14 MED ORDER — SERTRALINE HCL 25 MG PO TABS: 25.0000 mg | ORAL_TABLET | Freq: Every day | ORAL | 3 refills | Status: DC

## 2022-09-14 NOTE — Progress Notes (Signed)
NEUROLOGY FOLLOW UP OFFICE NOTE  Kato Signer [id number] 25-Apr-1996  HISTORY OF PRESENT ILLNESS: I had the pleasure of seeing Dannis Mallick in follow-up in the neurology clinic on 09/14/2022. He is alone in the office today. The patient was last seen a year ago. He had a convulsion in 11/2018 followed by recurrent episodes of sensory symptoms with some nausea and disorientation. He would have a flutter in his chest, tingling in his head, nausea, when more severe it is hard to do anything for 15-30 minutes. MRI brain and 48-hour EEG were normal. He was started on Lamotrigine for empiric treatment but then started mostly noticing symptoms in the setting of social anxiety. He was weaned off Lamotrigine in 08/2021 and has continued on Sertraline 25mg  daily for anxiety. He continues to note these episodes tend to occur in social situations, he has a sensation of adrenaline and weird head feeling. They are not very frequent. He denies any staring/unresponsive episodes, gaps in time, olfactory/gustatory hallucinations, deja vu, focal numbness/tingling/weakness, myoclonic jerks. No headaches, dizziness, vision changes, no falls. He started weekly Estradiol 3 months ago.    History on Initial Assessment 03/04/2020: This is a pleasant 27 year old right-handed man with history of left inner ear surgery, presenting for evaluation of new onset seizure that occurred last 11/2018. He was on the plane coming back from vacation when he started feeling nauseated, which he attributed to flight turbulence. He then started feeling a sensation across his forehead then his mother witnessed him become very rigid, arms drawn towards his chest with bilateral upper and lower extremity shaking. The episode lasted 40 seconds, his mother states he was confused after and had multiple episodes of vomiting. No tongue bite or incontinence. He recalls waking up in the plane feeling really tired and sleepy, he recalls answering questions. He was  brought to [facility] where CBC, CMP, UDS were negative. I personally reviewed MRI brain without contrast which was normal, hippocampi symmetric with no abnormal signal seen. He reported he did not get a lot of sleep the night prior due to travel. No alcohol or drug intake. Afterwards, he recalls having similar sensations across his forehead when he shaved his head in September, but he is not sure. He would have the similar sort of small sensations very briefly, however over the past 2-3 weeks, they were more frequent and longer. He was at school last 11/1 when he started feeling the same sensation across his forehead, he walked out the classroom and started to feel a little nausea. He had to lay down and started feeling better after 10 minutes but he was not completely back to usual baseline so he went to the ER where exam was normal. Last Friday, he was feeling the sensation most of the day. He denies any head pain or clear dizziness. He can talk but feels that speaking is a little difficult at times. When episodes are more intense, there is a small bit of nausea, no further vomiting similar to a year ago. He has noticed he is more sensitive to lights and some sounds, his eyes would feel overstimulated after working on the computer. Closing his eyes does not help, dimming the lights helps a little. He lives with his family and denies being told of any staring/unresponsive episodes. He denies any other gaps in time, olfactory/gustatory hallucinations, deja vu, rising epigastric sensation, focal numbness/tingling/weakness, myoclonic jerks. He denies any diplopia, dysarthria/dysphagia, back pain, bowel/bladder dysfunction. Sometimes when laying in bed, he feels a  sensation in the back of his head. He usually gets 8-10 hours of sleep but has some difficulty with sleep initiation. Memory is alright. He is in college at [employer] getting an Associate's degree in Fine Arts.   Epilepsy Risk Factors:  His deceased older brother had  drug-related seizures. He had a normal birth and early development.  There is no history of febrile convulsions, CNS infections such as meningitis/encephalitis, significant traumatic brain injury, neurosurgical procedures.   Diagnostic Data: MRI brain without contrast 11/2018 was normal, hippocampi symmetric with no abnormal signal seen.  Routine EEG 02/2020 normal 48-hour EEG 02/2020 normal. Two episodes of mild weird sensation over his head did not show any EEG correlate.   PAST MEDICAL HISTORY: Past Medical History:  Diagnosis Date   History of seizure     MEDICATIONS: Current Outpatient Medications on File Prior to Visit  Medication Sig Dispense Refill   estradiol valerate (DELESTROGEN) 20 MG/ML injection 0.2 ml weekly     sertraline (ZOLOFT) 25 MG tablet TAKE 1 TABLET BY MOUTH EVERY NIGHT 30 tablet 0   No current facility-administered medications on file prior to visit.    ALLERGIES: No Known Allergies  FAMILY HISTORY: Family History  Problem Relation Age of Onset   Healthy Mother    Healthy Father     SOCIAL HISTORY: Social History   Socioeconomic History   Marital status: Single    Spouse name: Not on file   Number of children: Not on file   Years of education: Not on file   Highest education level: Not on file  Occupational History   Not on file  Tobacco Use   Smoking status: Never   Smokeless tobacco: Never  Vaping Use   Vaping Use: Never used  Substance and Sexual Activity   Alcohol use: Never   Drug use: Never   Sexual activity: Not Currently  Other Topics Concern   Not on file  Social History Narrative   Right handed    Lives with family    Drinks little caffeine    One story home   Social Determinants of Health   Financial Resource Strain: Not on file  Food Insecurity: Not on file  Transportation Needs: Not on file  Physical Activity: Not on file  Stress: Not on file  Social Connections: Not on file  Intimate Partner Violence: Not on  file     PHYSICAL EXAM: Vitals:   09/14/22 0954  BP: (!) 123/58  Pulse: 74  SpO2: 98%   General: No acute distress Head:  Normocephalic/atraumatic Skin/Extremities: No rash, no edema Neurological Exam: alert and awake. No aphasia or dysarthria. Fund of knowledge is appropriate. Attention and concentration are normal.   Cranial nerves: Pupils equal, round. Extraocular movements intact with no nystagmus. Visual fields full.  No facial asymmetry.  Motor: Bulk and tone normal, muscle strength 5/5 throughout with no pronator drift.   Finger to nose testing intact.  Gait narrow-based and steady, able to tandem walk adequately.     IMPRESSION: This is a pleasant 27 yo RH man with history of left inner ear surgery, new onset convulsion in 11/2018 that was preceded by nausea and a sensation across his forehead. No convulsions since 11/2018. He presented with recurrent sensory symptoms where he describes a sensation in his head/disorientation. MRI brain and 48-hour EEG normal, typical events not captured. He was empirically treated with Lamotrigine for possible seizures, however after noticing episodes have been in social situations, weaned off Lamotrigine in 2023  with no seizure recurrence. He still has the sensations occurring in social situations, we agreed to continue Sertraline 25mg  daily. Continue to keep a calendar of symptoms, we can either try reducing dose to 1/2 tablet daily if episodes are infrequent, or increase to 50mg  daily, depending on frequency. He is aware of [place] driving laws to stop driving after a seizure until 6 months seizure-free. Follow-up in 1 year, call for any changes.   Thank you for allowing me to participate in his care.  Please do not hesitate to call for any questions or concerns.    Patrcia Dolly, M.D.   CC: Dr. Yetta Barre

## 2022-09-14 NOTE — Patient Instructions (Signed)
Good to see you.  Continue to keep a calendar of your symptoms and let me know if you would like to adjust Sertraline dose  2. Follow-up in 1 year, call for any changes   Seizure Precautions: 1. If medication has been prescribed for you to prevent seizures, take it exactly as directed.  Do not stop taking the medicine without talking to your doctor first, even if you have not had a seizure in a long time.   2. Avoid activities in which a seizure would cause danger to yourself or to others.  Don't operate dangerous machinery, swim alone, or climb in high or dangerous places, such as on ladders, roofs, or girders.  Do not drive unless your doctor says you may.  3. If you have any warning that you may have a seizure, lay down in a safe place where you can't hurt yourself.    4.  No driving for 6 months from last seizure, as per [facility].   Please refer to the following link on the Epilepsy Foundation of America's website for more information: http://www.epilepsyfoundation.org/answerplace/Social/driving/drivingu.cfm   5.  Maintain good sleep hygiene. Avoid alcohol.  6.  Contact your doctor if you have any problems that may be related to the medicine you are taking.  7.  Call 911 and bring the patient back to the ED if:        A.  The seizure lasts longer than 5 minutes.       B.  The patient doesn't awaken shortly after the seizure  C.  The patient has new problems such as difficulty seeing, speaking or moving  D.  The patient was injured during the seizure  E.  The patient has a temperature over 102 F (39C)  F.  The patient vomited and now is having trouble breathing

## 2022-11-10 DIAGNOSIS — H9192 Unspecified hearing loss, left ear: Secondary | ICD-10-CM | POA: Diagnosis not present

## 2022-11-10 DIAGNOSIS — Z9621 Cochlear implant status: Secondary | ICD-10-CM | POA: Diagnosis not present

## 2022-11-10 DIAGNOSIS — Z8669 Personal history of other diseases of the nervous system and sense organs: Secondary | ICD-10-CM | POA: Diagnosis not present

## 2022-11-11 DIAGNOSIS — R3129 Other microscopic hematuria: Secondary | ICD-10-CM | POA: Diagnosis not present

## 2023-07-27 HISTORY — PX: OTHER SURGICAL HISTORY: SHX169

## 2023-08-30 ENCOUNTER — Telehealth (INDEPENDENT_AMBULATORY_CARE_PROVIDER_SITE_OTHER): Payer: BC Managed Care – PPO | Admitting: Neurology

## 2023-08-30 ENCOUNTER — Encounter: Payer: Self-pay | Admitting: Neurology

## 2023-08-30 VITALS — Ht 72.0 in | Wt 255.0 lb

## 2023-08-30 DIAGNOSIS — R569 Unspecified convulsions: Secondary | ICD-10-CM

## 2023-08-30 DIAGNOSIS — F419 Anxiety disorder, unspecified: Secondary | ICD-10-CM

## 2023-08-30 NOTE — Patient Instructions (Addendum)
 Good to see you! Congratulations on your upcoming graduation. Continue working with [employer] on medication management. Follow-up as needed, call for any changes.    Seizure Precautions: 1. If medication has been prescribed for you to prevent seizures, take it exactly as directed.  Do not stop taking the medicine without talking to your doctor first, even if you have not had a seizure in a long time.   2. Avoid activities in which a seizure would cause danger to yourself or to others.  Don't operate dangerous machinery, swim alone, or climb in high or dangerous places, such as on ladders, roofs, or girders.  Do not drive unless your doctor says you may.  3. If you have any warning that you may have a seizure, lay down in a safe place where you can't hurt yourself.    4.  No driving for 6 months from last seizure, as per   state law.   Please refer to the following link on the Epilepsy Foundation of America's website for more information: http://www.epilepsyfoundation.org/answerplace/Social/driving/drivingu.cfm   5.  Maintain good sleep hygiene. Avoid alcohol.  6.  Contact your doctor if you have any problems that may be related to the medicine you are taking.  7.  Call 911 and bring the patient back to the ED if:        A.  The seizure lasts longer than 5 minutes.       B.  The patient doesn't awaken shortly after the seizure  C.  The patient has new problems such as difficulty seeing, speaking or moving  D.  The patient was injured during the seizure  E.  The patient has a temperature over 102 F (39C)  F.  The patient vomited and now is having trouble breathing

## 2023-08-30 NOTE — Progress Notes (Signed)
 Virtual Visit via Video Note The purpose of this virtual visit is to provide medical care while limiting exposure to the novel coronavirus.    Consent was obtained for video visit:  Yes.   Answered questions that patient had about telehealth interaction:  Yes.    Pt location: Home Physician Location: office Name of referring provider:  Clarise Crooks, MD I connected with Norton Beech at patients initiation/request on 08/30/2023 at 10:00 AM EDT by video enabled telemedicine application and verified that I am speaking with the correct person using two identifiers. Pt MRN:  [id number] Pt DOB:  1995/12/04 Video Participants:  Norton Beech   History of Present Illness:  The patient had a virtual video visit on 08/30/2023. He was last seen a year ago. He had a convulsion in 11/2018 followed by recurrent sensory symptoms with some nausea and disorientation. He would have a flutter in his chest, tingling in his head, when more severe it is hard to do anything for several minutes. MRI brain and 48-hour EEG normal. He was started on Lamotrigine  for seizures but started noticing the symptoms mostly occur in the setting of social anxiety. We weaned off Lamotrigine  in 08/2021 with no further convulsions since 2020. He was started on Sertraline  for anxiety and he did not notice them improve on 50mg  dose. He started having other mood symptoms that 100mg  dose did not help, he sees Psychiatry and a therapist, currently in the process of switching to Cymbalta and weaning off Sertraline . The sensations are much less frequent, he denies any staring/unresponsive episodes, gaps in time, olfactory/gustatory hallucinations, focal numbness/tingling/weakness, myoclonic jerks. No headaches, dizziness, vision changes, no falls. He gets more regular sleep, 6-9 hours. He is graduating soon.    History on Initial Assessment 03/04/2020: This is a pleasant 28 year old right-handed man with history of left inner ear surgery,  presenting for evaluation of new onset seizure that occurred last 11/2018. He was on the plane coming back from vacation when he started feeling nauseated, which he attributed to flight turbulence. He then started feeling a sensation across his forehead then his mother witnessed him become very rigid, arms drawn towards his chest with bilateral upper and lower extremity shaking. The episode lasted 40 seconds, his mother states he was confused after and had multiple episodes of vomiting. No tongue bite or incontinence. He recalls waking up in the plane feeling really tired and sleepy, he recalls answering questions. He was brought to [facility] where CBC, CMP, UDS were negative. I personally reviewed MRI brain without contrast which was normal, hippocampi symmetric with no abnormal signal seen. He reported he did not get a lot of sleep the night prior due to travel. No alcohol or drug intake. Afterwards, he recalls having similar sensations across his forehead when he shaved his head in September, but he is not sure. He would have the similar sort of small sensations very briefly, however over the past 2-3 weeks, they were more frequent and longer. He was at school last 11/1 when he started feeling the same sensation across his forehead, he walked out the classroom and started to feel a little nausea. He had to lay down and started feeling better after 10 minutes but he was not completely back to usual baseline so he went to the ER where exam was normal. Last Friday, he was feeling the sensation most of the day. He denies any head pain or clear dizziness. He can talk but feels that speaking is a  little difficult at times. When episodes are more intense, there is a small bit of nausea, no further vomiting similar to a year ago. He has noticed he is more sensitive to lights and some sounds, his eyes would feel overstimulated after working on the computer. Closing his eyes does not help, dimming the lights helps a little. He  lives with his family and denies being told of any staring/unresponsive episodes. He denies any other gaps in time, olfactory/gustatory hallucinations, deja vu, rising epigastric sensation, focal numbness/tingling/weakness, myoclonic jerks. He denies any diplopia, dysarthria/dysphagia, back pain, bowel/bladder dysfunction. Sometimes when laying in bed, he feels a sensation in the back of his head. He usually gets 8-10 hours of sleep but has some difficulty with sleep initiation. Memory is alright. He is in college at [employer] getting an Associate's degree in Fine Arts.   Epilepsy Risk Factors:  His deceased older brother had drug-related seizures. He had a normal birth and early development.  There is no history of febrile convulsions, CNS infections such as meningitis/encephalitis, significant traumatic brain injury, neurosurgical procedures.   Diagnostic Data: MRI brain without contrast 11/2018 was normal, hippocampi symmetric with no abnormal signal seen.  Routine EEG 02/2020 normal 48-hour EEG 02/2020 normal. Two episodes of mild weird sensation over his head did not show any EEG correlate.   Current Outpatient Medications on File Prior to Visit  Medication Sig Dispense Refill   DULoxetine (CYMBALTA) 30 MG capsule Take 30 mg by mouth daily.     estradiol valerate (DELESTROGEN) 20 MG/ML injection 0.2 ml weekly     progesterone (PROMETRIUM) 200 MG capsule Take 200 mg by mouth daily.     sertraline  (ZOLOFT ) 50 MG tablet Take 50 mg by mouth daily.     spironolactone (ALDACTONE) 50 MG tablet Take 50 mg by mouth 2 (two) times daily.     No current facility-administered medications on file prior to visit.     Observations/Objective:   Vitals:   08/30/23 0914  Weight: 255 lb (115.7 kg)  Height: 6' (1.829 m)   GEN:  The patient appears stated age and is in NAD.  Neurological examination: Patient is awake, alert. No aphasia or dysarthria. Intact fluency and comprehension. Cranial nerves: Extraocular  movements intact. No facial asymmetry. Motor: moves all extremities symmetrically, at least anti-gravity x 4.    Assessment and Plan:   This is a pleasant 28 yo RH man with history of left inner ear surgery, new onset convulsion in 11/2018 that was preceded by nausea and a sensation across his forehead. No convulsions since 11/2018. He presented with recurrent sensory symptoms where he describes a sensation in his head/disorientation. MRI brain and 48-hour EEG normal, typical events not captured. He was empirically treated with Lamotrigine  but started noticing episodes occur with social anxiety. He has weaned off Lamotrigine  in 2023 with no convulsions since 2020. The sensations are less frequent with Sertraline , he is switching to Cymbalta for other mood issues, continue to monitor symptoms with Behavioral Health. We talked about avoidance of seizure triggers (sleep deprivation, alcohol). He is aware of [place] driving laws to stop driving after a seizure until 6 months seizure-free. Follow-up as needed, he knows to call for any changes.    Follow Up Instructions:   -I discussed the assessment and treatment plan with the patient. The patient was provided an opportunity to ask questions and all were answered. The patient agreed with the plan and demonstrated an understanding of the instructions.   The patient was  advised to call back or seek an in-person evaluation if the symptoms worsen or if the condition fails to improve as anticipated.     Jhonny Moss, MD
